# Patient Record
Sex: Male | Born: 1990 | Race: Black or African American | Hispanic: No | Marital: Single | Smoking: Current every day smoker
Health system: Southern US, Community
[De-identification: ages and names within clinical notes are randomized; demographics above are authoritative.]

## PROBLEM LIST (undated history)

## (undated) DIAGNOSIS — J45909 Unspecified asthma, uncomplicated: Secondary | ICD-10-CM

## (undated) DIAGNOSIS — J40 Bronchitis, not specified as acute or chronic: Secondary | ICD-10-CM

## (undated) HISTORY — PX: TONSILLECTOMY: SUR1361

---
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
## 2009-03-19 ENCOUNTER — Emergency Department (HOSPITAL_COMMUNITY): Admission: EM | Admit: 2009-03-19 | Discharge: 2009-03-20 | Payer: Self-pay | Admitting: Emergency Medicine

## 2010-10-14 NOTE — Consult Note (Signed)
NAME:  Brent Anderson, Brent Anderson               ACCOUNT NO.:  [id number]   MEDICAL RECORD NO.:  [id number]          PATIENT TYPE:  EMS   LOCATION:  ED                            FACILITY:  [facility]   PHYSICIAN:  J. Darreld Mclean, M.D. DATE OF BIRTH:  08-31-1990   DATE OF CONSULTATION:  DATE OF DISCHARGE:                                   CONSULTATION   HISTORY:  Dr. Rhae Lerner. [place] asked [place] to see the patient.  The patient  is a 20 year old male who injured his right lower tibia playing ball today.  He has a comminuted spiral fracture of the right tibia, essentially  nondisplaced.  No other injuries, no loss of consciousness.   IMPRESSION:  Spiral fracture, right mid shaft tibia, comminuted.   The patient was placed in a long-leg cast.  I had previously talked to his  mother and the patient concerning the injury.  He is going to be in the cast  for 8-12 weeks.  I told him to keep it dry, elevation, ice, crutches, and a  prescription for Tylenol No. 3 and gave the patient information booklet.   FOLLOWUP:  I will see him in the office on Tuesday.  We are closed for the  Holidays until Tuesday.  I will see him in the office at 1:30.  Any  difficulty at all, come back to the hospitalization or call me through the  hospital beeper system.  Numbers have been provided to the patient.  Information booklet provided.      JWK/MEDQ  D:  05/19/2004  T:  05/20/2004  Job:  [id number]

## 2011-02-01 ENCOUNTER — Emergency Department (HOSPITAL_COMMUNITY): Payer: Self-pay

## 2011-02-01 ENCOUNTER — Encounter: Payer: Self-pay | Admitting: Emergency Medicine

## 2011-02-01 ENCOUNTER — Emergency Department (HOSPITAL_COMMUNITY)
Admission: EM | Admit: 2011-02-01 | Discharge: 2011-02-01 | Disposition: A | Discharge status: Home / Self Care | Payer: Self-pay | Attending: Emergency Medicine | Admitting: Emergency Medicine

## 2011-02-01 DIAGNOSIS — X58XXXA Exposure to other specified factors, initial encounter: Secondary | ICD-10-CM | POA: Insufficient documentation

## 2011-02-01 DIAGNOSIS — T07XXXA Unspecified multiple injuries, initial encounter: Secondary | ICD-10-CM

## 2011-02-01 DIAGNOSIS — IMO0002 Reserved for concepts with insufficient information to code with codable children: Secondary | ICD-10-CM | POA: Insufficient documentation

## 2011-02-01 DIAGNOSIS — Y92009 Unspecified place in unspecified non-institutional (private) residence as the place of occurrence of the external cause: Secondary | ICD-10-CM | POA: Insufficient documentation

## 2011-02-01 DIAGNOSIS — F17200 Nicotine dependence, unspecified, uncomplicated: Secondary | ICD-10-CM | POA: Insufficient documentation

## 2011-02-01 DIAGNOSIS — S8392XA Sprain of unspecified site of left knee, initial encounter: Secondary | ICD-10-CM

## 2011-02-01 DIAGNOSIS — M25469 Effusion, unspecified knee: Secondary | ICD-10-CM | POA: Insufficient documentation

## 2011-02-01 DIAGNOSIS — M7989 Other specified soft tissue disorders: Secondary | ICD-10-CM | POA: Insufficient documentation

## 2011-02-01 MED ORDER — IBUPROFEN 600 MG PO TABS: 600.0000 mg | ORAL_TABLET | Freq: Four times a day (QID) | ORAL | Status: AC | PRN

## 2011-02-01 MED ORDER — HYDROMORPHONE HCL 1 MG/ML IJ SOLN
1.0000 mg | Freq: Once | INTRAMUSCULAR | Status: AC
  Administered 2011-02-01: 1 mg via INTRAMUSCULAR
  Filled 2011-02-01: qty 1

## 2011-02-01 MED ORDER — OXYCODONE-ACETAMINOPHEN 5-325 MG PO TABS: 1.0000 | ORAL_TABLET | ORAL | Status: AC | PRN

## 2011-02-01 NOTE — ED Provider Notes (Signed)
History     CSN: [id number] Arrival date & time: 02/01/2011  8:07 PM  Chief Complaint  Patient presents with  . Leg Injury   Patient is a 20 y.o. male presenting with extremity pain. The history is provided by the patient.  Extremity Pain This is a new (legs became caught in farm equipment resulting in  injuries to bilateral knees) problem. Episode onset: just prior to arrival. The problem occurs constantly. The problem has not changed since onset.The symptoms are aggravated by walking. The symptoms are relieved by nothing. He has tried nothing for the symptoms.    History reviewed. No pertinent past medical history.  Past Surgical History  Procedure Date  . Tonsillectomy     No family history on file.  History  Substance Use Topics  . Smoking status: Current Everyday Smoker -- 0.5 packs/day    Types: Cigarettes  . Smokeless tobacco: Not on file  . Alcohol Use: No      Review of Systems  All other systems reviewed and are negative.    Physical Exam  BP 131/73  Pulse 82  Temp(Src) 98.2 F (36.8 C) (Oral)  Resp 24  Wt 165 lb (74.844 kg)  SpO2 100%  Physical Exam  Vitals reviewed. Constitutional: He is oriented to person, place, and time. He appears well-developed and well-nourished.  HENT:  Head: Normocephalic.  Eyes: EOM are normal.  Neck: Normal range of motion.  Pulmonary/Chest: Effort normal.  Musculoskeletal: Normal range of motion.       Left knee with several abrasions as well as swelling of medial joint line with tenderness. Right knee with tenderness of anterior joint line. Bilateral lower extremities are NVI  Neurological: He is alert and oriented to person, place, and time.  Psychiatric: He has a normal mood and affect.    ED Course  Procedures  MDM Suspect left knee sprain. Ortho follow up given swelling and small joint effusion.  Tetanus UTD. Crutches and knee immobolizer  i personally reviewed the xrays    Dg Knee Complete 4 Views  Left  02/01/2011  *RADIOLOGY REPORT*  Clinical Data: Pain post fall.  LEFT KNEE - COMPLETE 4+ VIEW  Comparison: None.  Findings: Small effusion in the suprapatellar bursa.  Negative for fracture, dislocation, or other acute bony abnormality.  Normal mineralization and alignment.  No significant osseous degenerative change.  IMPRESSION:  1.  Negative for fracture. 2.  Small knee effusion.  Original Report Authenticated By: Osa Craver, M.D.   Dg Knee Complete 4 Views Right  02/01/2011  *RADIOLOGY REPORT*  Clinical Data: Pain after injury  RIGHT KNEE - COMPLETE 4+ VIEW  Comparison: 05/19/2004  Findings: The right knee appears intact. No evidence of acute fracture or subluxation. Small benign-appearing exostosis of the proximal lateral tibial metaphysis.  Bone matrix and cortex appear intact.  No abnormal radiopaque densities in the soft tissues.  No significant effusion.  IMPRESSION: No acute bony abnormalities demonstrated.  Original Report Authenticated By: Marlon Pel, M.D.        Lyanne Co, MD 02/01/11 [phone]

## 2011-02-01 NOTE — ED Notes (Signed)
Pt reports getting shorts caught in auger of post hole drill. Abrasion & brusing noted to the left leg.

## 2011-02-01 NOTE — ED Notes (Signed)
Ice pack applied for swelling

## 2011-02-01 NOTE — ED Notes (Signed)
Patient states got his shorts caught in a piece of machinery and his shorts wrapped around his left knee.  Patient with abrasions noted to left knee circumferentially.

## 2012-07-28 IMAGING — CR DG KNEE COMPLETE 4+V*L*
4 series · 4 of 4 positions shown · non-contrast
Comparison: None.

CLINICAL DATA: Pain post fall.

LEFT KNEE - COMPLETE 4+ VIEW

[view not recorded (1 of 4)]
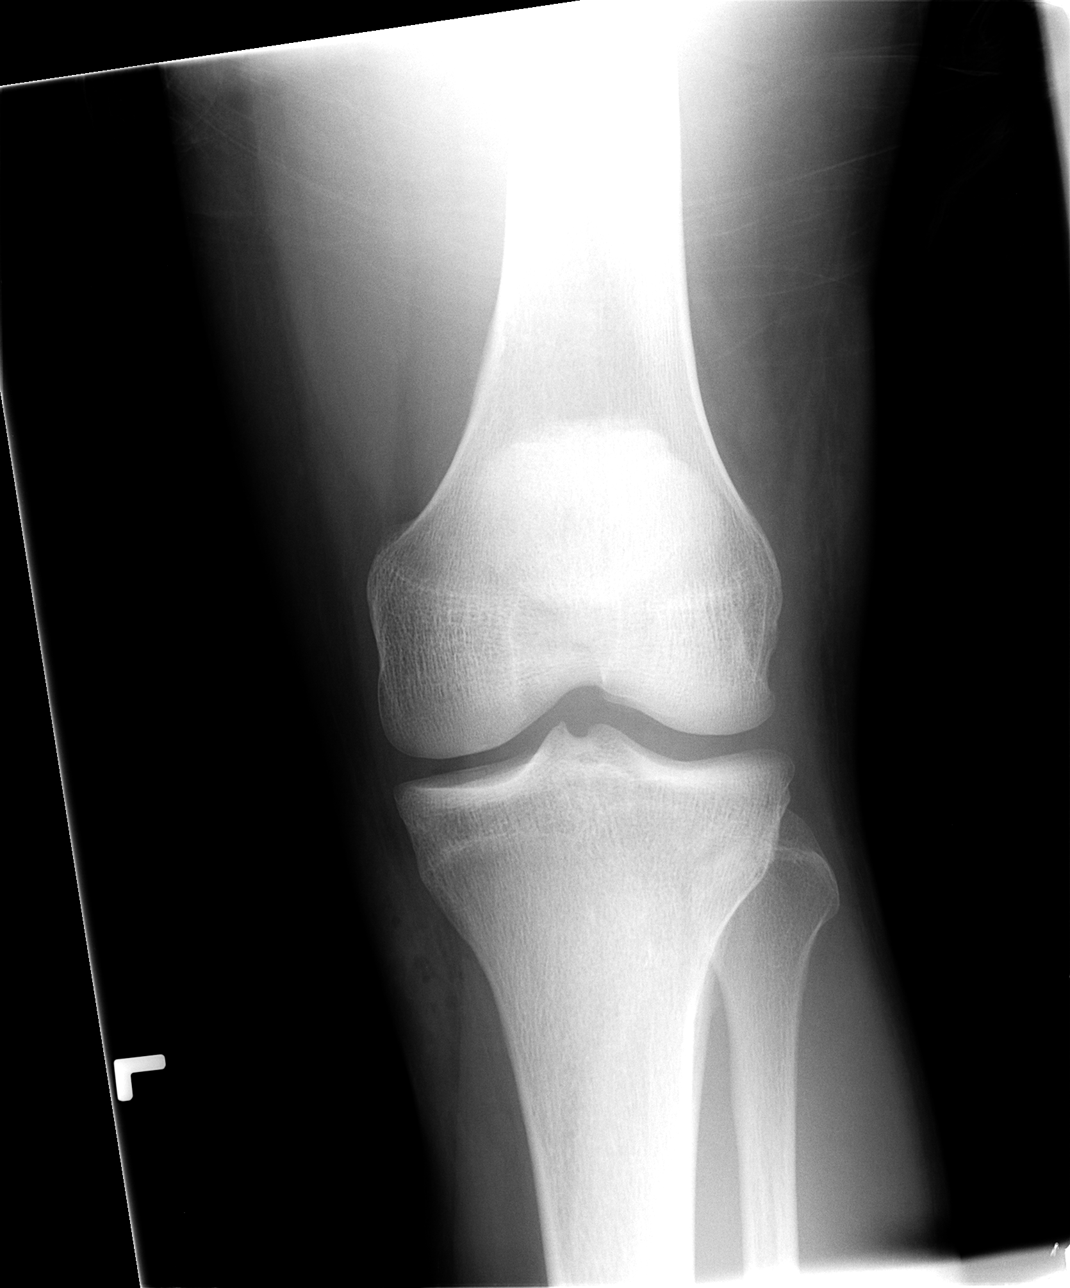

[view not recorded (2 of 4)]
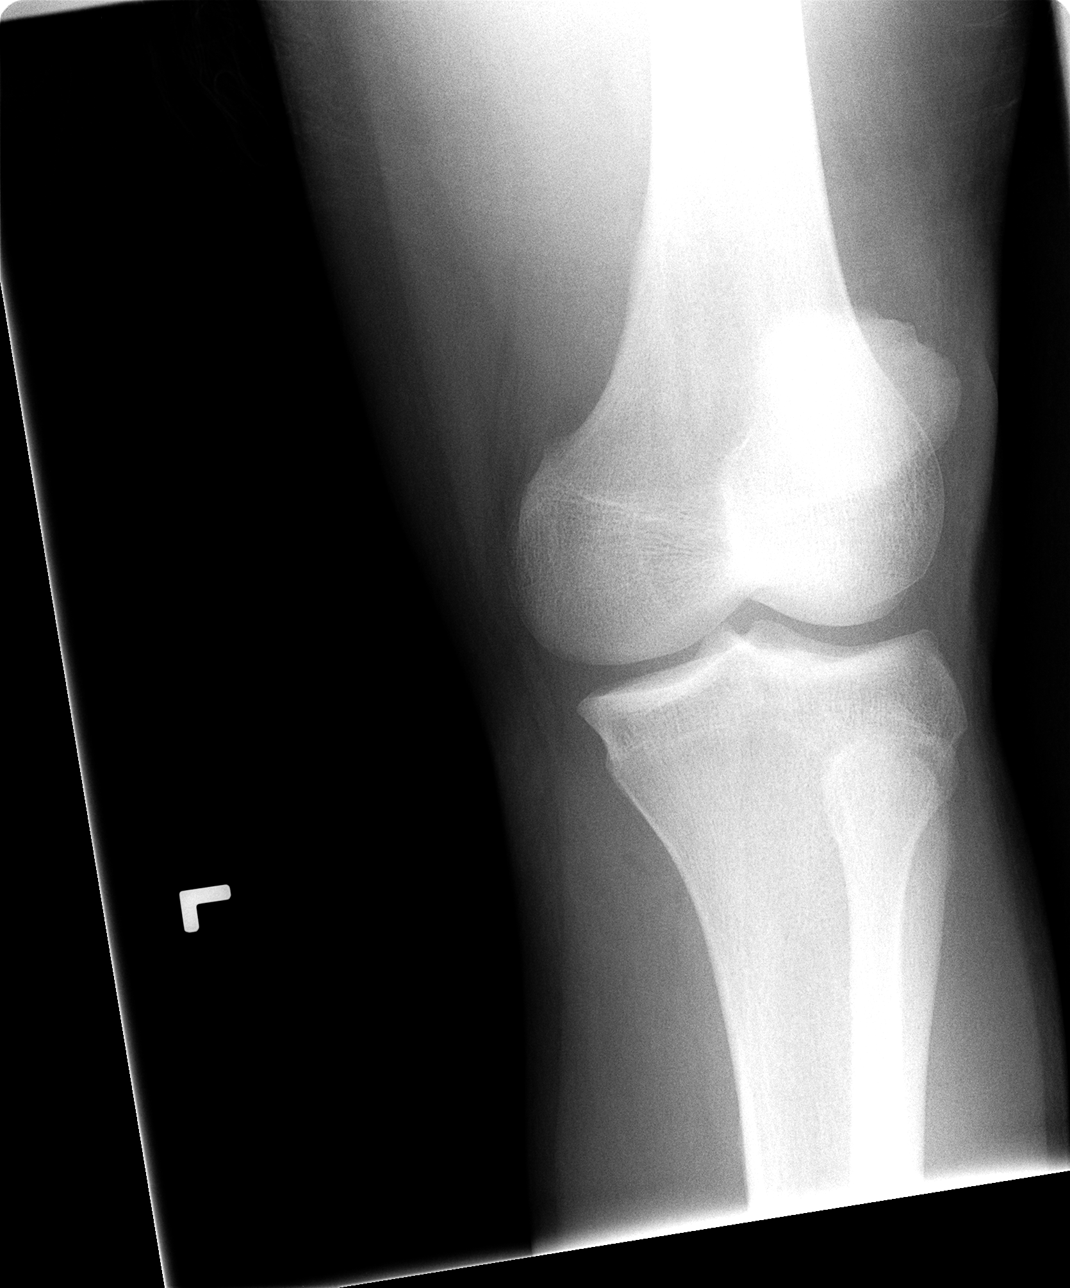

[view not recorded (3 of 4)]
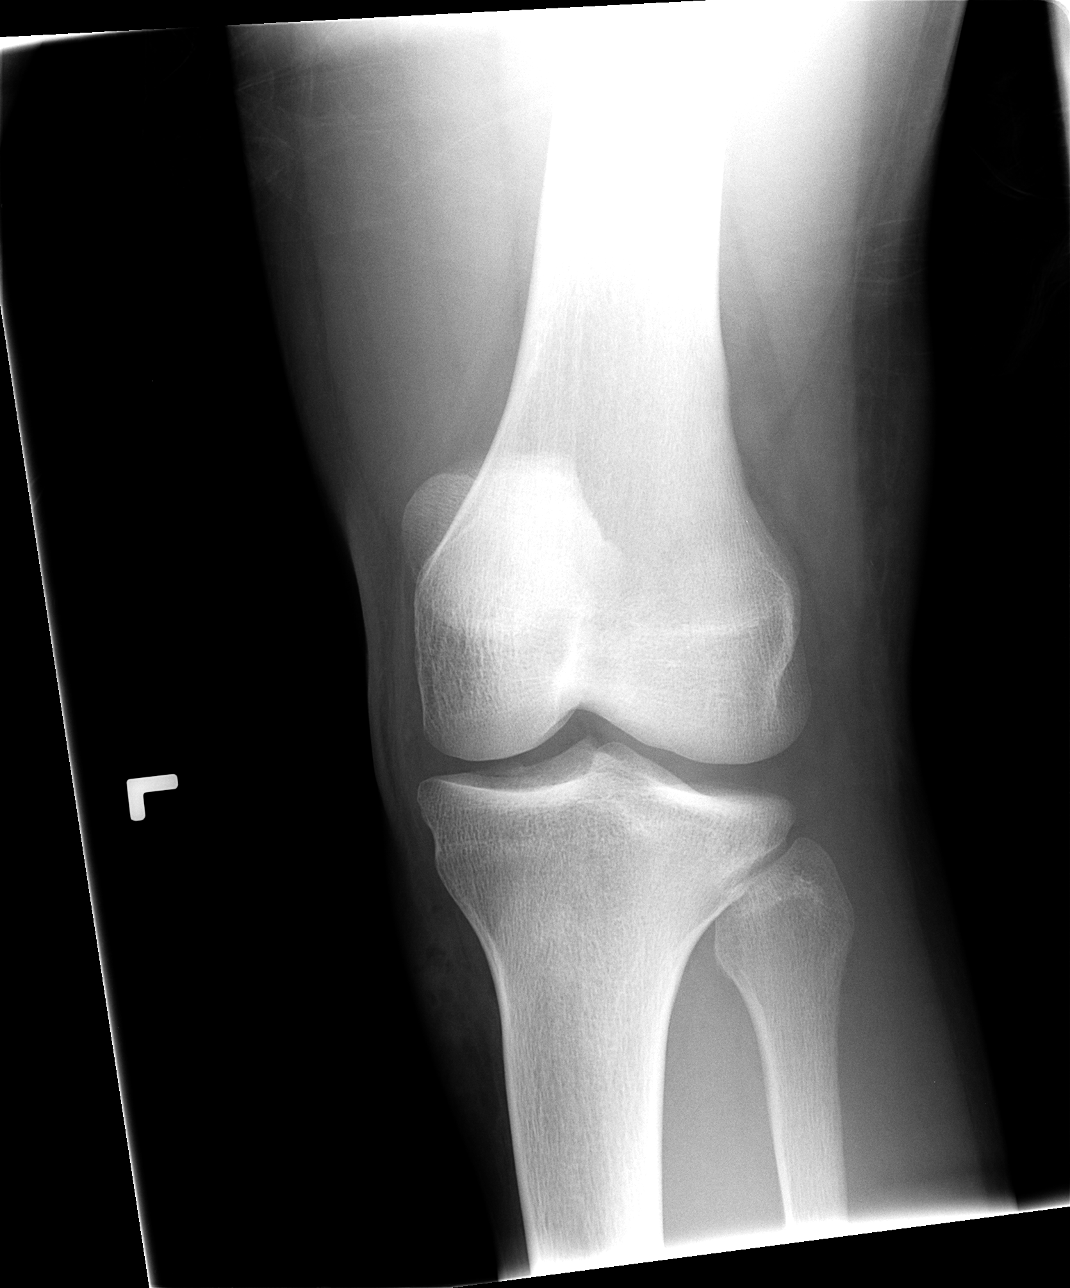

[view not recorded (4 of 4)]
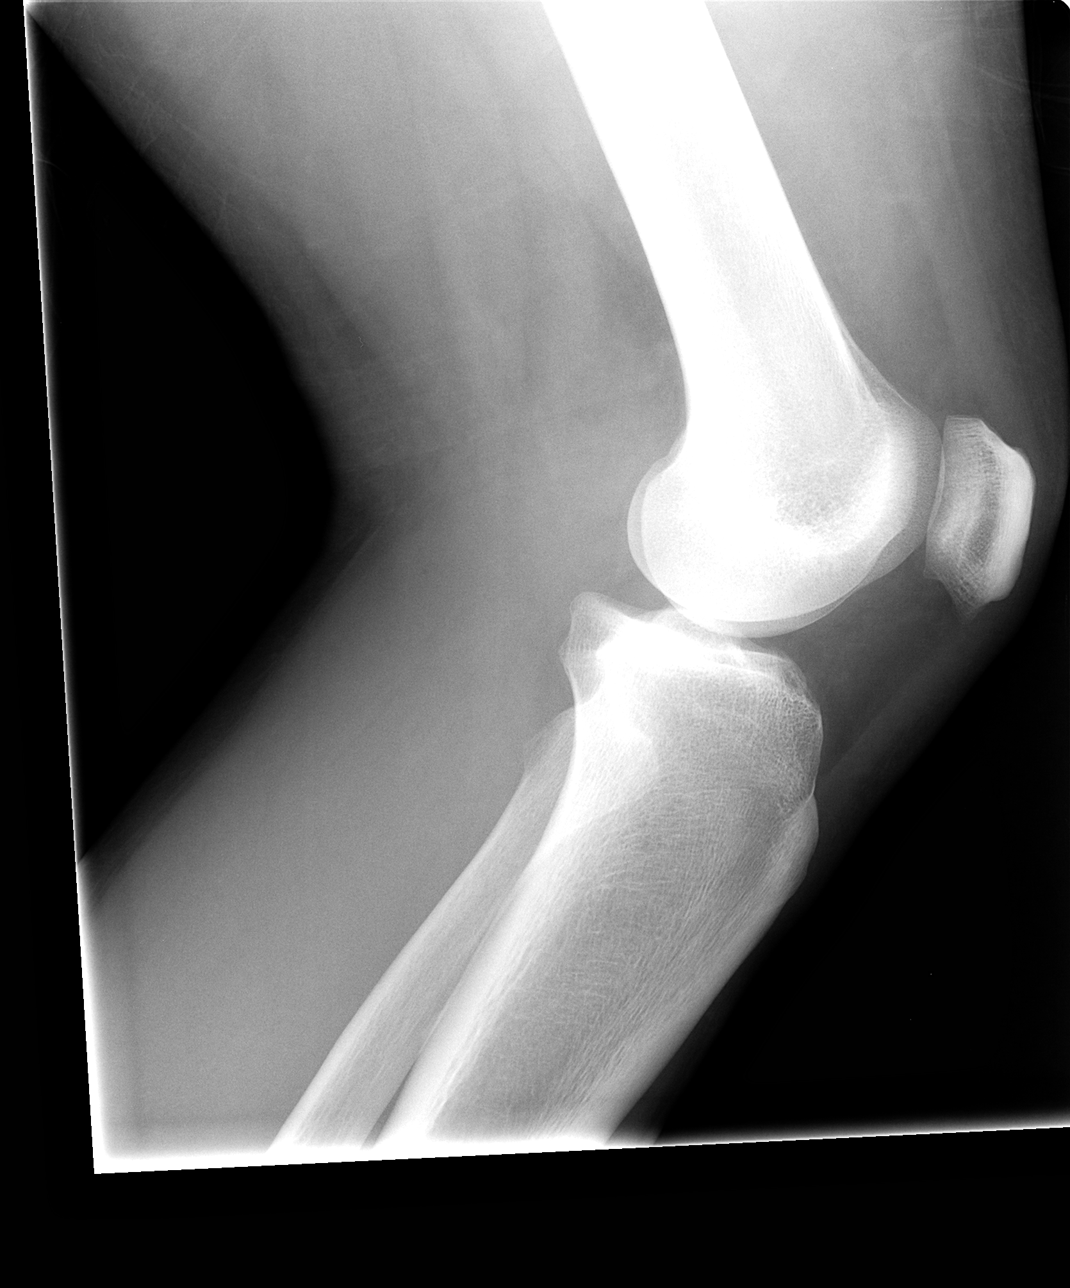

[4 of 4 positions shown; findings below may reference images not displayed]

FINDINGS: Small effusion in the suprapatellar bursa.  Negative for
fracture, dislocation, or other acute bony abnormality.  Normal
mineralization and alignment.  No significant osseous degenerative
change.
IMPRESSION: 1.  Negative for fracture.
2.  Small knee effusion.

## 2015-07-08 ENCOUNTER — Telehealth: Payer: Self-pay | Admitting: *Deleted

## 2015-07-08 MED ORDER — HYDROCODONE-ACETAMINOPHEN 5-325 MG PO TABS: 1.0000 | ORAL_TABLET | ORAL | Status: DC | PRN

## 2015-07-08 NOTE — Telephone Encounter (Signed)
Rx printed

## 2015-07-08 NOTE — Addendum Note (Signed)
Addended by: Earnstine Regal on: 07/08/2015 11:17 AM   Modules accepted: Orders

## 2015-07-08 NOTE — Telephone Encounter (Signed)
Needs refill on Hydrocodone 5

## 2015-07-28 ENCOUNTER — Ambulatory Visit: Payer: Self-pay | Admitting: Orthopaedic Surgery

## 2015-08-17 ENCOUNTER — Ambulatory Visit (INDEPENDENT_AMBULATORY_CARE_PROVIDER_SITE_OTHER): Payer: Self-pay | Admitting: Orthopaedic Surgery

## 2015-08-17 ENCOUNTER — Encounter: Payer: Self-pay | Admitting: Orthopaedic Surgery

## 2015-08-17 VITALS — BP 137/76 | HR 62 | Temp 97.9°F | Resp 16 | Ht 66.0 in | Wt 151.2 lb

## 2015-08-17 DIAGNOSIS — M25561 Pain in right knee: Secondary | ICD-10-CM

## 2015-08-17 MED ORDER — IBUPROFEN 600 MG PO TABS: 600.0000 mg | ORAL_TABLET | Freq: Four times a day (QID) | ORAL | Status: DC | PRN

## 2015-08-17 NOTE — Patient Instructions (Signed)
Begin ibuprofen, stop the narcotics

## 2015-08-17 NOTE — Progress Notes (Signed)
Patient ZO:XWRUEA:Brent Anderson, male DOB:02-22-1991, 25 y.o. VWU:[id number]  Chief Complaint  Patient presents with  . Follow-up    follow up Right knee "same"    HPI  Brent Anderson is a 25 y.o. male who has a history of right knee pain.  He has no swelling, no giving way, no locking, no popping. He is doing well.  HPI  Body mass index is 24.42 kg/(m^2).  Review of Systems  Patient does not have Diabetes Mellitus. Patient does not have hypertension. Patient has COPD or shortness of breath. Patient does not have BMI > 35. Patient has current smoking history.  Review of Systems  Constitutional: Negative for activity change.       Patient does not have Diabetes Mellitus. Patient does not have hypertension. Patient has COPD or shortness of breath. Patient does not have BMI > 35. Patient has current smoking history.  HENT: Negative for congestion.   Respiratory: Positive for cough and wheezing.   Musculoskeletal: Positive for arthralgias and gait problem.    No past medical history on file.  Past Surgical History  Procedure Laterality Date  . Tonsillectomy      No family history on file.  Social History Social History  Substance Use Topics  . Smoking status: Current Every Day Smoker -- 0.50 packs/day    Types: Cigarettes  . Smokeless tobacco: None  . Alcohol Use: No    No Known Allergies  Current Outpatient Prescriptions  Medication Sig Dispense Refill  . HYDROcodone-acetaminophen (NORCO/VICODIN) 5-325 MG tablet Take 1 tablet by mouth every 4 (four) hours as needed for moderate pain (Must last 30 days.  Do not take and drive a car or use machinery.). 120 tablet 0   No current facility-administered medications for this visit.     Physical Exam  Blood pressure 137/76, pulse 62, temperature 97.9 F (36.6 C), resp. rate 16, height 5\' 6"  (1.676 m), weight 151 lb 3.2 oz (68.584 kg).  Constitutional: overall normal hygiene, normal nutrition, well developed, normal  grooming, normal body habitus. Assistive device:none  Musculoskeletal: gait and station Limp none, muscle tone and strength are normal, no tremors or atrophy is present.  .  Neurological: coordination overall normal.  Deep tendon reflex/nerve stretch intact.  Sensation normal.  Cranial nerves II-XII intact.   Skin:   normal overall no scars, lesions, ulcers or rashes. No psoriasis.  Psychiatric: Alert and oriented x 3.  Recent memory intact, remote memory unclear.  Normal mood and affect. Well groomed.  Good eye contact.  Cardiovascular: overall no swelling, no varicosities, no edema bilaterally, normal temperatures of the legs and arms, no clubbing, cyanosis and good capillary refill.  Lymphatic: palpation is normal.  The right lower extremity is examined:  Inspection:  Thigh:  Non-tender and no defects  Knee does not have swelling 0 effusion.                        Joint tenderness is not present                        Patient is not tender over the medial joint line  Lower Leg:  Has normal appearance and no tenderness or defects  Ankle:  Non-tender and no defects  Foot:  Non-tender and no defects Range of Motion:  Knee:  Range of motion is: full and normal  Crepitus is not  present  Ankle:  Range of motion is normal. Strength and Tone:  The right lower extremity has normal strength and tone. Stability:  Knee:  The knee is stable.  Ankle:  The ankle is stable.   I will stop the narcotics as he has a normal exam today.   I will call in ibuprofen to [facility]  The patient has been educated about the nature of the problem(s) and counseled on treatment options.  The patient appeared to understand what I have discussed and is in agreement with it.  PLAN Call if any problems.  Precautions discussed.  Continue current medications.   Return to clinic 3 months

## 2015-11-16 ENCOUNTER — Ambulatory Visit: Payer: Self-pay | Admitting: Orthopaedic Surgery

## 2015-11-27 ENCOUNTER — Encounter (HOSPITAL_COMMUNITY): Payer: Self-pay | Admitting: Emergency Medicine

## 2015-11-27 ENCOUNTER — Emergency Department (HOSPITAL_COMMUNITY)
Admission: EM | Admit: 2015-11-27 | Discharge: 2015-11-27 | Disposition: A | Discharge status: Home / Self Care | Payer: Self-pay | Attending: Emergency Medicine | Admitting: Emergency Medicine

## 2015-11-27 DIAGNOSIS — F17210 Nicotine dependence, cigarettes, uncomplicated: Secondary | ICD-10-CM | POA: Insufficient documentation

## 2015-11-27 DIAGNOSIS — R21 Rash and other nonspecific skin eruption: Secondary | ICD-10-CM | POA: Insufficient documentation

## 2015-11-27 MED ORDER — FAMOTIDINE 20 MG PO TABS
20.0000 mg | ORAL_TABLET | Freq: Once | ORAL | Status: AC
  Administered 2015-11-27: 20 mg via ORAL
  Filled 2015-11-27: qty 1

## 2015-11-27 MED ORDER — HYDROXYZINE PAMOATE 25 MG PO CAPS: 25.0000 mg | ORAL_CAPSULE | Freq: Three times a day (TID) | ORAL | Status: AC | PRN

## 2015-11-27 MED ORDER — PREDNISONE 50 MG PO TABS
60.0000 mg | ORAL_TABLET | Freq: Once | ORAL | Status: AC
  Administered 2015-11-27: 60 mg via ORAL
  Filled 2015-11-27: qty 1

## 2015-11-27 MED ORDER — HYDROXYZINE HCL 25 MG PO TABS
25.0000 mg | ORAL_TABLET | Freq: Once | ORAL | Status: AC
  Administered 2015-11-27: 25 mg via ORAL
  Filled 2015-11-27: qty 1

## 2015-11-27 MED ORDER — DEXAMETHASONE 4 MG PO TABS: 4.0000 mg | ORAL_TABLET | Freq: Two times a day (BID) | ORAL | Status: AC

## 2015-11-27 NOTE — ED Notes (Signed)
Pt states he has had a rash on right hand for 2 days.

## 2015-11-27 NOTE — ED Provider Notes (Signed)
CSN: [id number]     Arrival date & time 11/27/15  1134 History   First MD Initiated Contact with Patient 11/27/15 1202     Chief Complaint  Patient presents with  . Rash     (Consider location/radiation/quality/duration/timing/severity/associated sxs/prior Treatment) HPI Comments: Patient states that over the last 2-3 days he has noticed a rash on his hands. This is spreading to his arms and other areas. The patient denies use of gloves. He denies having his hands and chemicals. He states he has been using a different lotion recently. He also reports having mowed the grass about 3 days ago just before the rash started on. He complains of itching that would not respond to over-the-counter medications. He has not had any difficulty with breathing, swallowing, or other issues.  The history is provided by the patient.    History reviewed. No pertinent past medical history. Past Surgical History  Procedure Laterality Date  . Tonsillectomy     History reviewed. No pertinent family history. Social History  Substance Use Topics  . Smoking status: Current Every Day Smoker -- 0.50 packs/day    Types: Cigarettes  . Smokeless tobacco: None  . Alcohol Use: No    Review of Systems  Constitutional: Negative for activity change.       All ROS Neg except as noted in HPI  HENT: Negative for nosebleeds.   Eyes: Negative for photophobia and discharge.  Respiratory: Negative for cough, shortness of breath and wheezing.   Cardiovascular: Negative for chest pain and palpitations.  Gastrointestinal: Negative for abdominal pain and blood in stool.  Genitourinary: Negative for dysuria, frequency and hematuria.  Musculoskeletal: Negative for back pain, arthralgias and neck pain.  Skin: Negative.   Neurological: Negative for dizziness, seizures and speech difficulty.  Psychiatric/Behavioral: Negative for hallucinations and confusion.      Allergies  Review of patient's allergies indicates no known  allergies.  Home Medications   Prior to Admission medications   Medication Sig Start Date End Date Taking? Authorizing Provider  ibuprofen (ADVIL,MOTRIN) 600 MG tablet Take 1 tablet (600 mg total) by mouth every 6 (six) hours as needed. 08/17/15   Darreld McleanWayne Keeling, MD   BP 117/73 mmHg  Pulse 61  Temp(Src) 98.2 F (36.8 C) (Oral)  Resp 16  Ht 5\' 6"  (1.676 m)  Wt 74.844 kg  BMI 26.64 kg/m2  SpO2 100% Physical Exam  Constitutional: He is oriented to person, place, and time. He appears well-developed and well-nourished.  Non-toxic appearance.  HENT:  Head: Normocephalic.  Right Ear: Tympanic membrane and external ear normal.  Left Ear: Tympanic membrane and external ear normal.  Eyes: EOM and lids are normal. Pupils are equal, round, and reactive to light.  Neck: Normal range of motion. Neck supple. Carotid bruit is not present.  Cardiovascular: Normal rate, regular rhythm, normal heart sounds, intact distal pulses and normal pulses.   Pulmonary/Chest: Breath sounds normal. No respiratory distress.  Abdominal: Soft. Bowel sounds are normal. There is no tenderness. There is no guarding.  Musculoskeletal: Normal range of motion.  Lymphadenopathy:       Head (right side): No submandibular adenopathy present.       Head (left side): No submandibular adenopathy present.    He has no cervical adenopathy.  Neurological: He is alert and oriented to person, place, and time. He has normal strength. No cranial nerve deficit or sensory deficit.  Skin: Skin is warm and dry.  Particular papular rash on the hands, arms, and a  few areas on the face on. A few of them have the blisters present. There is no red streaks noted. And there is no drainage from the areas.  Psychiatric: He has a normal mood and affect. His speech is normal.  Nursing note and vitals reviewed.   ED Course  Procedures (including critical care time) Labs Review Labs Reviewed - No data to display  Imaging Review No results  found. I have personally reviewed and evaluated these images and lab results as part of my medical decision-making.   EKG Interpretation None      MDM  Vital signs are within normal limits. There is no evidence of any airway compromise. The examination favors a probable contact dermatitis on. The patient will be treated with Decadron and Vistaril. Patient is referred to dermatology if these plans are not effective.    Final diagnoses:  Rash    **I have reviewed nursing notes, vital signs, and all appropriate lab and imaging results for this patient.Ivery Quale*    Danta Baumgardner, PA-C 11/28/15 1309  Benjiman CoreNathan Pickering, MD 11/29/15 [phone]

## 2015-11-27 NOTE — Discharge Instructions (Signed)
Please use Decadron 2 times daily. Use Claritin or over-the-counter Allegra for mild itching during the day, use Vistaril at bedtime or 3 times daily for itching if needed.This medication may cause drowsiness. Please do not drink, drive, or participate in activity that requires concentration while taking this medication. Please see the dermatologist listed above if not improving.

## 2016-08-12 ENCOUNTER — Emergency Department (HOSPITAL_COMMUNITY): Payer: No Typology Code available for payment source

## 2016-08-12 ENCOUNTER — Emergency Department (HOSPITAL_COMMUNITY)
Admission: EM | Admit: 2016-08-12 | Discharge: 2016-08-12 | Disposition: A | Discharge status: Home / Self Care | Payer: No Typology Code available for payment source | Attending: Emergency Medicine | Admitting: Emergency Medicine

## 2016-08-12 ENCOUNTER — Encounter (HOSPITAL_COMMUNITY): Payer: Self-pay

## 2016-08-12 DIAGNOSIS — M25511 Pain in right shoulder: Secondary | ICD-10-CM | POA: Diagnosis not present

## 2016-08-12 DIAGNOSIS — Y939 Activity, unspecified: Secondary | ICD-10-CM | POA: Insufficient documentation

## 2016-08-12 DIAGNOSIS — F17210 Nicotine dependence, cigarettes, uncomplicated: Secondary | ICD-10-CM | POA: Insufficient documentation

## 2016-08-12 DIAGNOSIS — Y999 Unspecified external cause status: Secondary | ICD-10-CM | POA: Insufficient documentation

## 2016-08-12 DIAGNOSIS — Y92410 Unspecified street and highway as the place of occurrence of the external cause: Secondary | ICD-10-CM | POA: Insufficient documentation

## 2016-08-12 DIAGNOSIS — S4991XA Unspecified injury of right shoulder and upper arm, initial encounter: Secondary | ICD-10-CM | POA: Diagnosis present

## 2016-08-12 MED ORDER — CYCLOBENZAPRINE HCL 10 MG PO TABS: 10.0000 mg | ORAL_TABLET | Freq: Two times a day (BID) | ORAL | 0 refills | Status: DC | PRN

## 2016-08-12 MED ORDER — IBUPROFEN 600 MG PO TABS: 600.0000 mg | ORAL_TABLET | Freq: Four times a day (QID) | ORAL | 0 refills | Status: DC | PRN

## 2016-08-12 NOTE — Discharge Instructions (Signed)
Do not drive while taking the muscle relaxant as it will make you sleepy. °
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
## 2018-04-25 ENCOUNTER — Other Ambulatory Visit: Payer: Self-pay

## 2018-04-25 ENCOUNTER — Encounter (HOSPITAL_COMMUNITY): Payer: Self-pay | Admitting: Emergency Medicine

## 2018-04-25 ENCOUNTER — Emergency Department (HOSPITAL_COMMUNITY)
Admission: EM | Admit: 2018-04-25 | Discharge: 2018-04-25 | Disposition: A | Discharge status: Home / Self Care | Payer: Self-pay | Attending: Emergency Medicine | Admitting: Emergency Medicine

## 2018-04-25 DIAGNOSIS — K029 Dental caries, unspecified: Secondary | ICD-10-CM | POA: Insufficient documentation

## 2018-04-25 DIAGNOSIS — Z79899 Other long term (current) drug therapy: Secondary | ICD-10-CM | POA: Insufficient documentation

## 2018-04-25 DIAGNOSIS — K0889 Other specified disorders of teeth and supporting structures: Secondary | ICD-10-CM

## 2018-04-25 DIAGNOSIS — F17210 Nicotine dependence, cigarettes, uncomplicated: Secondary | ICD-10-CM | POA: Insufficient documentation

## 2018-04-25 MED ORDER — IBUPROFEN 400 MG PO TABS
600.0000 mg | ORAL_TABLET | Freq: Once | ORAL | Status: AC
  Administered 2018-04-25: 600 mg via ORAL
  Filled 2018-04-25: qty 2

## 2018-04-25 MED ORDER — ACETAMINOPHEN 325 MG PO TABS
650.0000 mg | ORAL_TABLET | Freq: Once | ORAL | Status: AC
  Administered 2018-04-25: 650 mg via ORAL
  Filled 2018-04-25: qty 2

## 2018-04-25 MED ORDER — PENICILLIN V POTASSIUM 250 MG PO TABS
500.0000 mg | ORAL_TABLET | Freq: Once | ORAL | Status: AC
  Administered 2018-04-25: 500 mg via ORAL
  Filled 2018-04-25: qty 2

## 2018-04-25 MED ORDER — PENICILLIN V POTASSIUM 500 MG PO TABS: 500.0000 mg | ORAL_TABLET | Freq: Three times a day (TID) | ORAL | 0 refills | Status: DC

## 2018-04-25 NOTE — ED Provider Notes (Signed)
[facility] EMERGENCY DEPARTMENT Provider Note   CSN: [id number] Arrival date & time: 04/25/18  0545  Time seen 06:08 AM   History   Chief Complaint Chief Complaint  Patient presents with  . Dental Pain    HPI Brent Anderson is a 27 y.o. male.  HPI patient states he has been having some pain in his right lower wisdom to off and on for the past 3 months that he states has been some mild aching however this morning it woke him up at 2 AM being more intense.  He denies fever or swelling of his face.  He states that tooth has been cracked in half of its gone.  He denies any sensitivity to hot or cold or having pain when air hits his tooth when he breathes in.  He states he does not have a dentist however when I look in his mouth he is obviously had a dental work in the past and then he then states he saw a Education officer, community on [employer].  PCP Gareth Morgan, MD   History reviewed. No pertinent past medical history.  There are no active problems to display for this patient.   Past Surgical History:  Procedure Laterality Date  . TONSILLECTOMY          Home Medications    Prior to Admission medications   Medication Sig Start Date End Date Taking? Authorizing Provider  cyclobenzaprine (FLEXERIL) 10 MG tablet Take 1 tablet (10 mg total) by mouth 2 (two) times daily as needed for muscle spasms. 08/12/16   Janne Napoleon, NP  dexamethasone (DECADRON) 4 MG tablet Take 1 tablet (4 mg total) by mouth 2 (two) times daily with a meal. 11/27/15   Ivery Quale, PA-C  hydrOXYzine (VISTARIL) 25 MG capsule Take 1 capsule (25 mg total) by mouth 3 (three) times daily as needed. 11/27/15   Ivery Quale, PA-C  ibuprofen (ADVIL,MOTRIN) 600 MG tablet Take 1 tablet (600 mg total) by mouth every 6 (six) hours as needed. 08/12/16   Janne Napoleon, NP  penicillin v potassium (VEETID) 500 MG tablet Take 1 tablet (500 mg total) by mouth 3 (three) times daily. 04/25/18   Devoria Albe, MD    Family  History History reviewed. No pertinent family history.  Social History Social History   Tobacco Use  . Smoking status: Current Every Day Smoker    Packs/day: 0.50    Types: Cigarettes  . Smokeless tobacco: Never Used  Substance Use Topics  . Alcohol use: No  . Drug use: Yes    Types: Marijuana  employed   Allergies   Patient has no known allergies.   Review of Systems Review of Systems  All other systems reviewed and are negative.    Physical Exam Updated Vital Signs BP 122/85 (BP Location: Left Arm)   Pulse 68   Temp 97.9 F (36.6 C) (Oral)   Resp 16   Ht 5\' 8"  (1.727 m)   Wt 67.1 kg   SpO2 100%   BMI 22.50 kg/m   Vital signs normal    Physical Exam  Constitutional: He is oriented to person, place, and time.  Thin male  HENT:  Head: Normocephalic and atraumatic.  Right Ear: External ear normal.  Left Ear: External ear normal.  Nose: Nose normal.  Mouth/Throat: Oropharynx is clear and moist.    Eyes: Pupils are equal, round, and reactive to light. Conjunctivae and EOM are normal.  Neck: Normal range of motion.  Cardiovascular:  Normal rate.  Pulmonary/Chest: Effort normal. No respiratory distress.  Musculoskeletal: Normal range of motion.  Neurological: He is alert and oriented to person, place, and time. No cranial nerve deficit.  Skin: Skin is warm and dry.  Psychiatric: He has a normal mood and affect. His behavior is normal. Thought content normal.  Nursing note and vitals reviewed.    ED Treatments / Results  Labs (all labs ordered are listed, but only abnormal results are displayed) Labs Reviewed - No data to display  EKG None  Radiology No results found.  Procedures Procedures (including critical care time)  Medications Ordered in ED Medications  penicillin v potassium (VEETID) tablet 500 mg (has no administration in time range)  ibuprofen (ADVIL,MOTRIN) tablet 600 mg (has no administration in time range)  acetaminophen  (TYLENOL) tablet 650 mg (has no administration in time range)     Initial Impression / Assessment and Plan / ED Course  I have reviewed the triage vital signs and the nursing notes.  Pertinent labs & imaging results that were available during my care of the patient were reviewed by me and considered in my medical decision making (see chart for details).     Patient has a large cavity of his right lower molar.  He was started on pen VK and he was advised to take ibuprofen and Tylenol over-the-counter for pain.  He should try to follow-up with the dentist this coming week.  Please note there is no adult dentist on-call today.  Final Clinical Impressions(s) / ED Diagnoses   Final diagnoses:  Pain, dental  Dental caries    ED Discharge Orders         Ordered    penicillin v potassium (VEETID) 500 MG tablet  3 times daily     04/25/18 0621        OTC ibuprofen and acetaminophen  Plan discharge  Devoria AlbeIva Kaimen Peine, MD, Concha PyoFACEP    Nashalie Sallis, MD 04/25/18 [id number]

## 2018-04-25 NOTE — ED Triage Notes (Signed)
Pt c/o of right lower tooth hurting.

## 2018-04-25 NOTE — Discharge Instructions (Addendum)
Use heat over your face for comfort.  Take acetaminophen 650 mg plus ibuprofen 600 mg 4 times a day for pain.  Take the antibiotic until gone.  You will need to see a dentist to get definitive care of your tooth,  i.e. have the tooth pulled.  Return to the emergency department if you get a lot of facial swelling, or have difficulty swallowing or breathing.

## 2019-03-27 ENCOUNTER — Encounter (HOSPITAL_COMMUNITY): Payer: Self-pay

## 2019-03-27 ENCOUNTER — Emergency Department (HOSPITAL_COMMUNITY)
Admission: EM | Admit: 2019-03-27 | Discharge: 2019-03-27 | Disposition: A | Discharge status: Home / Self Care | Payer: Self-pay | Attending: Emergency Medicine | Admitting: Emergency Medicine

## 2019-03-27 ENCOUNTER — Other Ambulatory Visit: Payer: Self-pay

## 2019-03-27 DIAGNOSIS — K029 Dental caries, unspecified: Secondary | ICD-10-CM | POA: Insufficient documentation

## 2019-03-27 DIAGNOSIS — F17210 Nicotine dependence, cigarettes, uncomplicated: Secondary | ICD-10-CM | POA: Insufficient documentation

## 2019-03-27 DIAGNOSIS — F1210 Cannabis abuse, uncomplicated: Secondary | ICD-10-CM | POA: Insufficient documentation

## 2019-03-27 MED ORDER — PENICILLIN V POTASSIUM 500 MG PO TABS: 500.0000 mg | ORAL_TABLET | Freq: Four times a day (QID) | ORAL | 0 refills | Status: AC

## 2019-03-27 NOTE — ED Provider Notes (Signed)
[facility] EMERGENCY DEPARTMENT Provider Note   CSN: [id number] Arrival date & time: 03/27/19  0725     History   Chief Complaint Chief Complaint  Patient presents with  . Dental Pain    HPI Brent Anderson is a 28 y.o. male.     28 year old male with left lower dental pain.  Patient first noticed discomfort in the tooth about 2 weeks ago, more painful for the past few days.  Denies fever, trauma, drainage.  No other complaints or concerns.     History reviewed. No pertinent past medical history.  There are no active problems to display for this patient.   Past Surgical History:  Procedure Laterality Date  . TONSILLECTOMY          Home Medications    Prior to Admission medications   Medication Sig Start Date End Date Taking? Authorizing Provider  cyclobenzaprine (FLEXERIL) 10 MG tablet Take 1 tablet (10 mg total) by mouth 2 (two) times daily as needed for muscle spasms. 08/12/16   Janne Napoleon, NP  dexamethasone (DECADRON) 4 MG tablet Take 1 tablet (4 mg total) by mouth 2 (two) times daily with a meal. 11/27/15   Ivery Quale, PA-C  hydrOXYzine (VISTARIL) 25 MG capsule Take 1 capsule (25 mg total) by mouth 3 (three) times daily as needed. 11/27/15   Ivery Quale, PA-C  ibuprofen (ADVIL,MOTRIN) 600 MG tablet Take 1 tablet (600 mg total) by mouth every 6 (six) hours as needed. 08/12/16   Janne Napoleon, NP  penicillin v potassium (VEETID) 500 MG tablet Take 1 tablet (500 mg total) by mouth 4 (four) times daily for 7 days. 03/27/19 04/03/19  Jeannie Fend, PA-C    Family History No family history on file.  Social History Social History   Tobacco Use  . Smoking status: Current Every Day Smoker    Packs/day: 0.50    Types: Cigarettes  . Smokeless tobacco: Never Used  Substance Use Topics  . Alcohol use: No  . Drug use: Yes    Types: Marijuana     Allergies   Patient has no known allergies.   Review of Systems Review of Systems  Constitutional:  Negative for fever.  HENT: Positive for dental problem. Negative for ear pain, facial swelling, trouble swallowing and voice change.   Gastrointestinal: Negative for nausea and vomiting.  Musculoskeletal: Negative for neck pain.  Skin: Negative for rash and wound.  Allergic/Immunologic: Negative for immunocompromised state.  Neurological: Negative for headaches.  Hematological: Negative for adenopathy.  Psychiatric/Behavioral: Negative for confusion.  All other systems reviewed and are negative.    Physical Exam Updated Vital Signs BP (!) 146/95 (BP Location: Left Arm)   Pulse 65   Temp 98 F (36.7 C) (Oral)   Resp 14   Ht 5\' 7"  (1.702 m)   Wt 64 kg   SpO2 100%   BMI 22.08 kg/m   Physical Exam Vitals signs and nursing note reviewed.  Constitutional:      General: He is not in acute distress.    Appearance: He is well-developed. He is not diaphoretic.  HENT:     Head: Normocephalic and atraumatic.     Mouth/Throat:     Mouth: Mucous membranes are moist.     Dentition: Abnormal dentition. Dental tenderness and dental caries present. No gingival swelling, dental abscesses or gum lesions.     Pharynx: Oropharynx is clear. Uvula midline.   Neck:     Musculoskeletal: Neck supple.  Pulmonary:     Effort: Pulmonary effort is normal.  Lymphadenopathy:     Cervical: No cervical adenopathy.  Skin:    General: Skin is warm and dry.     Findings: No erythema or rash.  Neurological:     Mental Status: He is alert and oriented to person, place, and time.  Psychiatric:        Behavior: Behavior normal.      ED Treatments / Results  Labs (all labs ordered are listed, but only abnormal results are displayed) Labs Reviewed - No data to display  EKG None  Radiology No results found.  Procedures Procedures (including critical care time)  Medications Ordered in ED Medications - No data to display   Initial Impression / Assessment and Plan / ED Course  I have  reviewed the triage vital signs and the nursing notes.  Pertinent labs & imaging results that were available during my care of the patient were reviewed by me and considered in my medical decision making (see chart for details).  Clinical Course as of Mar 26 752  Thu Mar 26, 9865  5913 28 year old male with complaint of left lower dental pain, known cavity.  Denies fever, trauma, drainage.  No overlying facial cellulitis, no lymphadenopathy.  Patient start on penicillin, advised take Motrin Tylenol and referred to area dentist.   [LM]    Clinical Course User Index [LM] Tacy Learn, PA-C        Final Clinical Impressions(s) / ED Diagnoses   Final diagnoses:  Dental caries    ED Discharge Orders         Ordered    penicillin v potassium (VEETID) 500 MG tablet  4 times daily     03/27/19 0749           Tacy Learn, PA-C 03/27/19 6720    Daleen Bo, MD 03/27/19 1801

## 2019-03-27 NOTE — ED Triage Notes (Signed)
Pt c/o left sided toothache x 2 weeks.

## 2019-03-27 NOTE — Discharge Instructions (Addendum)
Take penicillin as prescribed and complete the full course. Rinse with Listerine after every meal.   Take 600 mg of Advil liquid gels and 500 mg Tylenol every 6 hours for pain.  Follow-up with a dentist as soon as possible, you have been referred to Dr. Haig Prophet, call the office today to schedule an appointment.  There is also an attached list of area tenderness.

## 2020-04-12 ENCOUNTER — Encounter (HOSPITAL_COMMUNITY): Payer: Self-pay | Admitting: Emergency Medicine

## 2020-04-12 ENCOUNTER — Emergency Department (HOSPITAL_COMMUNITY)
Admission: EM | Admit: 2020-04-12 | Discharge: 2020-04-12 | Disposition: A | Discharge status: Home / Self Care | Payer: Self-pay | Attending: Emergency Medicine | Admitting: Emergency Medicine

## 2020-04-12 ENCOUNTER — Other Ambulatory Visit: Payer: Self-pay

## 2020-04-12 DIAGNOSIS — Z0289 Encounter for other administrative examinations: Secondary | ICD-10-CM

## 2020-04-12 DIAGNOSIS — Z0279 Encounter for issue of other medical certificate: Secondary | ICD-10-CM | POA: Insufficient documentation

## 2020-04-12 DIAGNOSIS — F17210 Nicotine dependence, cigarettes, uncomplicated: Secondary | ICD-10-CM | POA: Insufficient documentation

## 2020-04-12 NOTE — Clinical Social Work Note (Signed)
CSW visited pt in ED due to pt having no insurance or PCP. CSW explained [facility] and referral process to pt. Pt does not want referral made to [facility]. TOC signing off.

## 2020-04-12 NOTE — ED Provider Notes (Signed)
[facility] EMERGENCY DEPARTMENT Provider Note   CSN: [id number] Arrival date & time: 04/12/20  1905     History Chief Complaint  Patient presents with  . work note    Brent Anderson is a 29 y.o. male without significant past medical history, presenting to the emergency department for a work note.  Patient states last week he had a syncopal episode at work which he attributes to dehydration and not eating throughout the day.  He states his syncope was preceded by lightheadedness, he felt well after the fall.  No head trauma.  Denies palpitations or chest pain prior to the syncope.  He has not had any recurrence.  He was evaluated by EMS when that happened, however did not get transferred and went home to rest.  He felt much better after eating and drinking.  He has no current complaints, requests work note to return to work tonight as his employer will not allow him to return without an excuse.  He does not have a PCP.  The history is provided by the patient.       History reviewed. No pertinent past medical history.  There are no problems to display for this patient.   Past Surgical History:  Procedure Laterality Date  . TONSILLECTOMY         History reviewed. No pertinent family history.  Social History   Tobacco Use  . Smoking status: Current Every Day Smoker    Packs/day: 0.50    Types: Cigarettes  . Smokeless tobacco: Never Used  Vaping Use  . Vaping Use: Never used  Substance Use Topics  . Alcohol use: No  . Drug use: Yes    Types: Marijuana    Home Medications Prior to Admission medications   Medication Sig Start Date End Date Taking? Authorizing Provider  cyclobenzaprine (FLEXERIL) 10 MG tablet Take 1 tablet (10 mg total) by mouth 2 (two) times daily as needed for muscle spasms. 08/12/16   Janne Napoleon, NP  dexamethasone (DECADRON) 4 MG tablet Take 1 tablet (4 mg total) by mouth 2 (two) times daily with a meal. 11/27/15   Ivery Quale, PA-C    hydrOXYzine (VISTARIL) 25 MG capsule Take 1 capsule (25 mg total) by mouth 3 (three) times daily as needed. 11/27/15   Ivery Quale, PA-C  ibuprofen (ADVIL,MOTRIN) 600 MG tablet Take 1 tablet (600 mg total) by mouth every 6 (six) hours as needed. 08/12/16   Janne Napoleon, NP    Allergies    Patient has no known allergies.  Review of Systems   Review of Systems  All other systems reviewed and are negative.   Physical Exam Updated Vital Signs BP 123/81 (BP Location: Right Arm)   Pulse 72   Temp 98.3 F (36.8 C) (Oral)   Resp 17   Ht 5\' 7"  (1.702 m)   Wt 70.3 kg   SpO2 100%   BMI 24.28 kg/m   Physical Exam Vitals and nursing note reviewed.  Constitutional:      General: He is not in acute distress.    Appearance: He is well-developed. He is not ill-appearing.  HENT:     Head: Normocephalic and atraumatic.  Eyes:     Conjunctiva/sclera: Conjunctivae normal.     Pupils: Pupils are equal, round, and reactive to light.  Cardiovascular:     Rate and Rhythm: Normal rate and regular rhythm.  Pulmonary:     Effort: Pulmonary effort is normal.     Breath  sounds: Normal breath sounds.  Neurological:     Mental Status: He is alert.  Psychiatric:        Mood and Affect: Mood normal.        Behavior: Behavior normal.     ED Results / Procedures / Treatments   Labs (all labs ordered are listed, but only abnormal results are displayed) Labs Reviewed - No data to display  EKG None  Radiology No results found.  Procedures Procedures (including critical care time)  Medications Ordered in ED Medications - No data to display  ED Course  I have reviewed the triage vital signs and the nursing notes.  Pertinent labs & imaging results that were available during my care of the patient were reviewed by me and considered in my medical decision making (see chart for details).    MDM Rules/Calculators/A&P                          Patient presented to the ED for work note to  return to work tonight after having a syncopal episode last week.  He admits he did not eat or drink anything throughout the day prior to the syncope which had a prodrome of lightheadedness.  No injuries from the syncope.  No recurrence and he felt much better after eating and drinking.  He was evaluated by EMS, however did not get transferred to the ED.  He has no current complaints.  On exam he is well-appearing stable vital signs.  Patient provided work note and encouraged he establish PCP.  Provided referral and discussed return precautions.  Patient verbalized understanding and agrees with care plan.   Final Clinical Impression(s) / ED Diagnoses Final diagnoses:  Encounter to obtain excuse from work    Rx / DC Orders ED Discharge Orders    None       Tacey Dimaggio, [place] N, PA-C 04/12/20 2007    Gerhard Munch, MD 04/12/20 2317

## 2020-04-12 NOTE — Discharge Instructions (Signed)
Please establish primary care. Return if you have recurrent symptoms.

## 2020-04-12 NOTE — ED Triage Notes (Signed)
Pt reports he was seen by EMS at work last week for a syncopal episode; he was not transferred by EMS but left work and went home, pt reports he was absent from work for 3 days; he reported to work today and his employer told him he could not work until he has a Loss adjuster, chartered note stating he his medically clear to work, pt reports he does not need a work excuse for missed days last week; no medical complaints today

## 2020-05-12 ENCOUNTER — Other Ambulatory Visit: Payer: Self-pay

## 2020-05-12 ENCOUNTER — Emergency Department (HOSPITAL_COMMUNITY)
Admission: EM | Admit: 2020-05-12 | Discharge: 2020-05-12 | Disposition: A | Discharge status: Home / Self Care | Payer: Self-pay | Attending: Emergency Medicine | Admitting: Emergency Medicine

## 2020-05-12 ENCOUNTER — Encounter (HOSPITAL_COMMUNITY): Payer: Self-pay | Admitting: Emergency Medicine

## 2020-05-12 DIAGNOSIS — S01112A Laceration without foreign body of left eyelid and periocular area, initial encounter: Secondary | ICD-10-CM | POA: Insufficient documentation

## 2020-05-12 DIAGNOSIS — Z5321 Procedure and treatment not carried out due to patient leaving prior to being seen by health care provider: Secondary | ICD-10-CM | POA: Insufficient documentation

## 2020-05-12 HISTORY — DX: Bronchitis, not specified as acute or chronic: J40

## 2020-05-12 HISTORY — DX: Unspecified asthma, uncomplicated: J45.909
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
## 2020-06-05 ENCOUNTER — Other Ambulatory Visit: Payer: Self-pay

## 2020-06-05 ENCOUNTER — Encounter (HOSPITAL_COMMUNITY): Payer: Self-pay | Admitting: Emergency Medicine

## 2020-06-05 ENCOUNTER — Emergency Department (HOSPITAL_COMMUNITY)
Admission: EM | Admit: 2020-06-05 | Discharge: 2020-06-05 | Disposition: A | Discharge status: Home / Self Care | Payer: HRSA Program | Attending: Emergency Medicine | Admitting: Emergency Medicine

## 2020-06-05 DIAGNOSIS — J45909 Unspecified asthma, uncomplicated: Secondary | ICD-10-CM | POA: Diagnosis not present

## 2020-06-05 DIAGNOSIS — R509 Fever, unspecified: Secondary | ICD-10-CM | POA: Diagnosis present

## 2020-06-05 DIAGNOSIS — F17210 Nicotine dependence, cigarettes, uncomplicated: Secondary | ICD-10-CM | POA: Insufficient documentation

## 2020-06-05 DIAGNOSIS — U071 COVID-19: Secondary | ICD-10-CM | POA: Diagnosis not present

## 2020-06-05 LAB — URINALYSIS, ROUTINE W REFLEX MICROSCOPIC
Bilirubin Urine: NEGATIVE
Glucose, UA: NEGATIVE mg/dL
Ketones, ur: 5 mg/dL — AB
Leukocytes,Ua: NEGATIVE
Nitrite: NEGATIVE
Protein, ur: 100 mg/dL — AB
Specific Gravity, Urine: 1.027 (ref 1.005–1.030)
pH: 5 (ref 5.0–8.0)

## 2020-06-05 LAB — POC SARS CORONAVIRUS 2 AG -  ED: SARS Coronavirus 2 Ag: POSITIVE — AB

## 2020-06-05 MED ORDER — ACETAMINOPHEN 325 MG PO TABS
650.0000 mg | ORAL_TABLET | Freq: Once | ORAL | Status: AC
  Administered 2020-06-05: 650 mg via ORAL
  Filled 2020-06-05: qty 2

## 2020-06-05 MED ORDER — ONDANSETRON 4 MG PO TBDP
4.0000 mg | ORAL_TABLET | Freq: Once | ORAL | Status: AC
  Administered 2020-06-05: 4 mg via ORAL
  Filled 2020-06-05: qty 1

## 2020-06-05 MED ORDER — ONDANSETRON 4 MG PO TBDP: 4.0000 mg | ORAL_TABLET | Freq: Three times a day (TID) | ORAL | 0 refills | Status: DC | PRN

## 2020-06-05 NOTE — ED Notes (Signed)
Drinking ginger ale   No vomiting

## 2020-06-05 NOTE — ED Triage Notes (Signed)
Back pain x 5 days   No meds OTC  No PCP visit/call  Here for eval   Fluid movement

## 2020-06-05 NOTE — Discharge Instructions (Addendum)
You tested positive for COVID-19 today which is the source of your symptoms.  It will be important that you maintain home quarantine for total of 7 days from the onset of your symptoms.  However if you continue to have symptoms beyond the 7-day mark you will need to continue your home quarantine until your symptoms are improved and your Covid recheck test is negative.  Rest to make sure you are drinking plenty of fluids.  I recommend Tylenol or Motrin for fever reduction in headache reduction.  Use the Zofran prescribed to help you with your nausea.  You can obtain a repeat Covid test once you are better at [address] in [facility] [place].  My understanding is they offered drive through Covid testing.  In the interim return here if you have any worsening symptoms, specifically if you develop shortness of breath or severe weakness.

## 2020-06-06 NOTE — ED Provider Notes (Signed)
[facility] EMERGENCY DEPARTMENT Provider Note   CSN: [id number] Arrival date & time: 06/05/20  1047     History Chief Complaint  Patient presents with  . Back Pain    Brent Anderson is a 30 y.o. male with a history of childhood asthma presenting for evaluation of a 2-day history generalized myalgias, fatigue, bilateral back pain, describing aching in his lower back but also across to shoulders.  He also endorses subjective fever and chills intermittently.  He has had no cough or shortness of breath, denies wheezing, chest pain, does have mild nausea, no vomiting, abdominal pain, dysuria or diarrhea.  His cough has been nonproductive.  He presents with other family members with symptoms that are suggestive of possible COVID-19 infection.  He has had no medications for treatment of his symptoms.  He has not been vaccinated for Covid.   He works third shift and was found sleeping at his workstation 2 nights ago, was sent home from work with instructions to get tested for Covid.  HPI     Past Medical History:  Diagnosis Date  . Asthma   . Bronchitis     There are no problems to display for this patient.   Past Surgical History:  Procedure Laterality Date  . TONSILLECTOMY         History reviewed. No pertinent family history.  Social History   Tobacco Use  . Smoking status: Current Every Day Smoker    Packs/day: 0.50    Types: Cigarettes  . Smokeless tobacco: Never Used  Vaping Use  . Vaping Use: Never used  Substance Use Topics  . Alcohol use: No    Comment: occ.  . Drug use: Yes    Frequency: 7.0 times per week    Types: Marijuana    Home Medications Prior to Admission medications   Medication Sig Start Date End Date Taking? Authorizing Provider  ondansetron (ZOFRAN ODT) 4 MG disintegrating tablet Take 1 tablet (4 mg total) by mouth every 8 (eight) hours as needed for nausea or vomiting. 06/05/20  Yes Yanessa Hocevar, Raynelle Fanning, PA-C  cyclobenzaprine (FLEXERIL) 10 MG tablet  Take 1 tablet (10 mg total) by mouth 2 (two) times daily as needed for muscle spasms. 08/12/16   Janne Napoleon, NP  dexamethasone (DECADRON) 4 MG tablet Take 1 tablet (4 mg total) by mouth 2 (two) times daily with a meal. 11/27/15   Ivery Quale, PA-C  hydrOXYzine (VISTARIL) 25 MG capsule Take 1 capsule (25 mg total) by mouth 3 (three) times daily as needed. 11/27/15   Ivery Quale, PA-C  ibuprofen (ADVIL,MOTRIN) 600 MG tablet Take 1 tablet (600 mg total) by mouth every 6 (six) hours as needed. 08/12/16   Janne Napoleon, NP    Allergies    Patient has no known allergies.  Review of Systems   Review of Systems  Constitutional: Positive for chills, fatigue and fever.  HENT: Negative for congestion and sore throat.   Eyes: Negative.   Respiratory: Positive for cough. Negative for chest tightness, shortness of breath and wheezing.   Cardiovascular: Negative for chest pain.  Gastrointestinal: Negative for abdominal pain, diarrhea, nausea and vomiting.  Genitourinary: Negative.   Musculoskeletal: Negative for arthralgias, joint swelling and neck pain.  Skin: Negative.  Negative for rash and wound.  Neurological: Positive for weakness. Negative for dizziness, light-headedness, numbness and headaches.  Psychiatric/Behavioral: Negative.     Physical Exam Updated Vital Signs BP 116/78 (BP Location: Right Arm)   Pulse 71  Temp 99 F (37.2 C) (Oral)   Resp 18   Ht 5\' 7"  (1.702 m)   Wt 70.3 kg   SpO2 99%   BMI 24.27 kg/m   Physical Exam Vitals and nursing note reviewed.  Constitutional:      Appearance: He is well-developed and well-nourished.     Comments: Febrile.  HENT:     Head: Normocephalic and atraumatic.     Nose: Nose normal.     Mouth/Throat:     Mouth: Mucous membranes are moist.     Pharynx: No posterior oropharyngeal erythema.  Eyes:     Conjunctiva/sclera: Conjunctivae normal.  Cardiovascular:     Rate and Rhythm: Normal rate and regular rhythm.     Pulses: Intact  distal pulses.     Heart sounds: Normal heart sounds.  Pulmonary:     Effort: Pulmonary effort is normal. No respiratory distress.     Breath sounds: Normal breath sounds. No wheezing or rhonchi.  Abdominal:     General: Bowel sounds are normal.     Palpations: Abdomen is soft.     Tenderness: There is no abdominal tenderness. There is no guarding.  Musculoskeletal:        General: Normal range of motion.     Cervical back: Normal range of motion. No rigidity.  Lymphadenopathy:     Cervical: No cervical adenopathy.  Skin:    General: Skin is warm and dry.  Neurological:     General: No focal deficit present.     Mental Status: He is alert.  Psychiatric:        Mood and Affect: Mood and affect normal.     ED Results / Procedures / Treatments   Labs (all labs ordered are listed, but only abnormal results are displayed) Labs Reviewed  URINALYSIS, ROUTINE W REFLEX MICROSCOPIC - Abnormal; Notable for the following components:      Result Value   APPearance HAZY (*)    Hgb urine dipstick SMALL (*)    Ketones, ur 5 (*)    Protein, ur 100 (*)    Bacteria, UA FEW (*)    All other components within normal limits  POC SARS CORONAVIRUS 2 AG -  ED - Abnormal; Notable for the following components:   SARS Coronavirus 2 Ag POSITIVE (*)    All other components within normal limits    EKG None  Radiology No results found.  Procedures Procedures (including critical care time)  Medications Ordered in ED Medications  acetaminophen (TYLENOL) tablet 650 mg (650 mg Oral Given 06/05/20 1127)  ondansetron (ZOFRAN-ODT) disintegrating tablet 4 mg (4 mg Oral Given 06/05/20 1150)    ED Course  I have reviewed the triage vital signs and the nursing notes.  Pertinent labs & imaging results that were available during my care of the patient were reviewed by me and considered in my medical decision making (see chart for details).    MDM Rules/Calculators/A&P                          Patient  has tested positive for COVID-19.  He has no complaints of shortness of breath and his vital signs here are stable.  He was given Tylenol and his fever responded appropriately.  He was also given Zofran and his nausea improved.  He was able to tolerate p.o. fluids prior to discharge home.  He was given home instructions for symptomatic treatment, rest, increase fluid intake.  He  was instructed in quarantine protocol, strict return precautions were also outlined for this patient.  Ringo A Luckett was evaluated in Emergency Department on 06/06/2020 for the symptoms described in the history of present illness. He was evaluated in the context of the global COVID-19 pandemic, which necessitated consideration that the patient might be at risk for infection with the SARS-CoV-2 virus that causes COVID-19. Institutional protocols and algorithms that pertain to the evaluation of patients at risk for COVID-19 are in a state of rapid change based on information released by regulatory bodies including the CDC and federal and state organizations. These policies and algorithms were followed during the patient's care in the ED.  Final Clinical Impression(s) / ED Diagnoses Final diagnoses:  COVID-19    Rx / DC Orders ED Discharge Orders         Ordered    ondansetron (ZOFRAN ODT) 4 MG disintegrating tablet  Every 8 hours PRN        06/05/20 1246           Burgess Amor, Cordelia Poche 06/06/20 2016    Jacalyn Lefevre, MD 06/08/20 [phone]

## 2020-12-29 ENCOUNTER — Encounter (HOSPITAL_COMMUNITY): Payer: Self-pay

## 2020-12-29 ENCOUNTER — Emergency Department (HOSPITAL_COMMUNITY)
Admission: EM | Admit: 2020-12-29 | Discharge: 2020-12-29 | Disposition: A | Discharge status: Home / Self Care | Payer: Self-pay | Attending: Emergency Medicine | Admitting: Emergency Medicine

## 2020-12-29 ENCOUNTER — Other Ambulatory Visit: Payer: Self-pay

## 2020-12-29 DIAGNOSIS — U071 COVID-19: Secondary | ICD-10-CM | POA: Insufficient documentation

## 2020-12-29 DIAGNOSIS — J45909 Unspecified asthma, uncomplicated: Secondary | ICD-10-CM | POA: Insufficient documentation

## 2020-12-29 DIAGNOSIS — Z28310 Unvaccinated for covid-19: Secondary | ICD-10-CM | POA: Insufficient documentation

## 2020-12-29 DIAGNOSIS — F17210 Nicotine dependence, cigarettes, uncomplicated: Secondary | ICD-10-CM | POA: Insufficient documentation

## 2020-12-29 LAB — CBC WITH DIFFERENTIAL/PLATELET
Abs Immature Granulocytes: 0.02 10*3/uL (ref 0.00–0.07)
Basophils Absolute: 0.1 10*3/uL (ref 0.0–0.1)
Basophils Relative: 1 %
Eosinophils Absolute: 0 10*3/uL (ref 0.0–0.5)
Eosinophils Relative: 0 %
HCT: 46.6 % (ref 39.0–52.0)
Hemoglobin: 15.2 g/dL (ref 13.0–17.0)
Immature Granulocytes: 0 %
Lymphocytes Relative: 26 %
Lymphs Abs: 1.2 10*3/uL (ref 0.7–4.0)
MCH: 27.9 pg (ref 26.0–34.0)
MCHC: 32.6 g/dL (ref 30.0–36.0)
MCV: 85.5 fL (ref 80.0–100.0)
Monocytes Absolute: 0.6 10*3/uL (ref 0.1–1.0)
Monocytes Relative: 14 %
Neutro Abs: 2.6 10*3/uL (ref 1.7–7.7)
Neutrophils Relative %: 59 %
Platelets: 196 10*3/uL (ref 150–400)
RBC: 5.45 MIL/uL (ref 4.22–5.81)
RDW: 13.6 % (ref 11.5–15.5)
WBC: 4.5 10*3/uL (ref 4.0–10.5)
nRBC: 0 % (ref 0.0–0.2)

## 2020-12-29 LAB — BASIC METABOLIC PANEL
Anion gap: 8 (ref 5–15)
BUN: 8 mg/dL (ref 6–20)
CO2: 27 mmol/L (ref 22–32)
Calcium: 9 mg/dL (ref 8.9–10.3)
Chloride: 103 mmol/L (ref 98–111)
Creatinine, Ser: 1.08 mg/dL (ref 0.61–1.24)
GFR, Estimated: >60 mL/min (ref >60)
Glucose, Bld: 90 mg/dL (ref 70–99)
Potassium: 3.7 mmol/L (ref 3.5–5.1)
Sodium: 138 mmol/L (ref 135–145)

## 2020-12-29 LAB — RESP PANEL BY RT-PCR (FLU A&B, COVID) ARPGX2
Influenza A by PCR: NEGATIVE
Influenza B by PCR: NEGATIVE
SARS Coronavirus 2 by RT PCR: POSITIVE — AB

## 2020-12-29 MED ORDER — ONDANSETRON 8 MG PO TBDP
8.0000 mg | ORAL_TABLET | Freq: Once | ORAL | Status: AC
  Administered 2020-12-29: 8 mg via ORAL
  Filled 2020-12-29: qty 1

## 2020-12-29 MED ORDER — ONDANSETRON HCL 4 MG PO TABS: 4.0000 mg | ORAL_TABLET | Freq: Four times a day (QID) | ORAL | 0 refills | Status: AC

## 2020-12-29 MED ORDER — NIRMATRELVIR/RITONAVIR (PAXLOVID)TABLET: 3.0000 | ORAL_TABLET | Freq: Two times a day (BID) | ORAL | 0 refills | Status: AC

## 2020-12-29 NOTE — Discharge Instructions (Addendum)
You have tested positive for COVID, started on the antiviral treatment please take as prescribed.  will also give you Zofran for nausea please use as needed.  I recommend taking Tylenol for fever control and ibuprofen for pain control please follow dosing on the back of bottle.  I recommend staying hydrated and if you do not an appetite, I recommend soups as this will provide you with fluids and calories.    you are Covid positive you must self quarantine for 5 days starting on symptom onset, if at the end of those 5 days you are feeling better you may return back to school/work, if you continue to have symptoms you must self quarantine for additional 5 days.  I would like you to contact "post Covid care" as they will provide you with information how to manage your Covid symptoms  Come back to the emergency department if you develop chest pain, shortness of breath, severe abdominal pain, uncontrolled nausea, vomiting, diarrhea.

## 2020-12-29 NOTE — ED Notes (Signed)
Pt tolerating oral fluid intake at this time. 

## 2020-12-29 NOTE — ED Triage Notes (Signed)
Pt reports vomiting, back pain, an sleeping difficulty since Monday.  Denies cough.  Denies diarrhea.  LBM  was Monday morning. Reports was around someone with covid Sunday night.

## 2020-12-29 NOTE — ED Notes (Addendum)
Date and time results received: 12/29/20 1135 (use smartphrase ".now" to insert current time)  Test: Covid Critical Value: Positive  Name of Provider Notified: Uvaldo Rising, [place]  Orders Received? Or Actions Taken?:

## 2020-12-29 NOTE — ED Provider Notes (Signed)
[facility] EMERGENCY DEPARTMENT Provider Note   CSN: [id number] Arrival date & time: 12/29/20  1941     History Chief Complaint  Patient presents with   Vomiting    Brent Anderson is a 30 y.o. male.  HPI  Patient with significant medical history of asthma, bronchitis presents to the emergency department with chief complaint of not feeling well.  Patient states this started yesterday morning around 3 AM, states he feels generalized body aches, nausea, vomiting, feeling fatigue, headaches, and a slight cough.  Patient is not vaccine against COVID-19, admits to recent COVID exposures, he is not immunocompromise.  He denies chest pain, shortness of breath, constipation, diarrhea, he denies hematemesis or coffee-ground emesis.  States he has a lack of appetite due to the nausea.  He has no other complaints at this time.  Past Medical History:  Diagnosis Date   Asthma    Bronchitis     There are no problems to display for this patient.   Past Surgical History:  Procedure Laterality Date   TONSILLECTOMY         No family history on file.  Social History   Tobacco Use   Smoking status: Every Day    Packs/day: 0.50    Types: Cigarettes   Smokeless tobacco: Never  Vaping Use   Vaping Use: Never used  Substance Use Topics   Alcohol use: No    Comment: occ.   Drug use: Yes    Frequency: 7.0 times per week    Types: Marijuana    Home Medications Prior to Admission medications   Medication Sig Start Date End Date Taking? Authorizing Provider  nirmatrelvir/ritonavir EUA (PAXLOVID) TABS Take 3 tablets by mouth 2 (two) times daily for 5 days. Take nirmatrelvir (150 mg) two tablets twice daily for 5 days and ritonavir (100 mg) one tablet twice daily for 5 days. 12/29/20 01/03/21 Yes Carroll Sage, PA-C  ondansetron (ZOFRAN) 4 MG tablet Take 1 tablet (4 mg total) by mouth every 6 (six) hours. 12/29/20  Yes Carroll Sage, PA-C  cyclobenzaprine (FLEXERIL) 10 MG tablet  Take 1 tablet (10 mg total) by mouth 2 (two) times daily as needed for muscle spasms. 08/12/16   Janne Napoleon, NP  dexamethasone (DECADRON) 4 MG tablet Take 1 tablet (4 mg total) by mouth 2 (two) times daily with a meal. 11/27/15   Ivery Quale, PA-C  hydrOXYzine (VISTARIL) 25 MG capsule Take 1 capsule (25 mg total) by mouth 3 (three) times daily as needed. 11/27/15   Ivery Quale, PA-C  ibuprofen (ADVIL,MOTRIN) 600 MG tablet Take 1 tablet (600 mg total) by mouth every 6 (six) hours as needed. 08/12/16   Janne Napoleon, NP  ondansetron (ZOFRAN ODT) 4 MG disintegrating tablet Take 1 tablet (4 mg total) by mouth every 8 (eight) hours as needed for nausea or vomiting. 06/05/20   Burgess Amor, PA-C    Allergies    Patient has no known allergies.  Review of Systems   Review of Systems  Constitutional:  Positive for fatigue. Negative for chills and fever.  HENT:  Negative for congestion.   Respiratory:  Positive for cough. Negative for shortness of breath.   Cardiovascular:  Negative for chest pain.  Gastrointestinal:  Positive for nausea and vomiting. Negative for abdominal pain and diarrhea.  Genitourinary:  Negative for enuresis.  Musculoskeletal:  Positive for myalgias. Negative for back pain.  Skin:  Negative for rash.  Neurological:  Positive for headaches. Negative for  dizziness.  Hematological:  Does not bruise/bleed easily.   Physical Exam Updated Vital Signs BP 131/79   Pulse 82   Temp 99.2 F (37.3 C) (Oral)   Resp 16   Ht 5\' 8"  (1.727 m)   Wt 74.8 kg   SpO2 99%   BMI 25.09 kg/m   Physical Exam Vitals and nursing note reviewed.  Constitutional:      General: He is not in acute distress.    Appearance: He is not ill-appearing.  HENT:     Head: Normocephalic and atraumatic.     Right Ear: Tympanic membrane, ear canal and external ear normal.     Left Ear: Tympanic membrane, ear canal and external ear normal.     Nose: No congestion or rhinorrhea.     Mouth/Throat:      Mouth: Mucous membranes are moist.     Pharynx: Oropharynx is clear. No oropharyngeal exudate or posterior oropharyngeal erythema.  Eyes:     Conjunctiva/sclera: Conjunctivae normal.  Cardiovascular:     Rate and Rhythm: Normal rate and regular rhythm.     Pulses: Normal pulses.     Heart sounds: No murmur heard.   No friction rub. No gallop.  Pulmonary:     Effort: No respiratory distress.     Breath sounds: No wheezing, rhonchi or rales.  Abdominal:     Palpations: Abdomen is soft.     Tenderness: There is no abdominal tenderness. There is no right CVA tenderness or left CVA tenderness.  Musculoskeletal:     Right lower leg: No edema.     Left lower leg: No edema.  Skin:    General: Skin is warm and dry.  Neurological:     Mental Status: He is alert.  Psychiatric:        Mood and Affect: Mood normal.    ED Results / Procedures / Treatments   Labs (all labs ordered are listed, but only abnormal results are displayed) Labs Reviewed  RESP PANEL BY RT-PCR (FLU A&B, COVID) ARPGX2 - Abnormal; Notable for the following components:      Result Value   SARS Coronavirus 2 by RT PCR POSITIVE (*)    All other components within normal limits  CBC WITH DIFFERENTIAL/PLATELET  BASIC METABOLIC PANEL    EKG None  Radiology No results found.  Procedures Procedures   Medications Ordered in ED Medications  ondansetron (ZOFRAN-ODT) disintegrating tablet 8 mg (8 mg Oral Given 12/29/20 1133)    ED Course  I have reviewed the triage vital signs and the nursing notes.  Pertinent labs & imaging results that were available during my care of the patient were reviewed by me and considered in my medical decision making (see chart for details).    MDM Rules/Calculators/A&P                          Initial impression-patient presents with URI-like symptoms he is alert, does not appear in acute stress, vital signs reassuring.  Suspect he has a viral URI, will obtain respiratory panel, add  on CMP, CBC provide him with Zofran and p.o. challenge.  Work-up-CBC negative, BMP negative, respiratory panel positive for COVID  Reassessment-patient reassessed, continues have no complaints this time, tolerating p.o.  Patient is agreeable for discharge at this time.  Rule out- Low suspicion for systemic infection as patient is nontoxic-appearing, vital signs reassuring, no obvious source infection noted on exam.  Low suspicion for pneumonia as lung  sounds are clear bilaterally, x-ray did not reveal any acute findings.  I have low suspicion for PE as patient denies pleuritic chest pain, shortness of breath, patient is PERC. low suspicion for strep throat as oropharynx was visualized, no erythema or exudates noted.  Low suspicion patient would need  hospitalized due to viral infection or Covid as vital signs reassuring, patient is not in respiratory distress.    Plan-  URI--suspect likely from COVID, patient is at increased risk for an adverse outcome due to his unvaccinated state, will start him on antivirals, will also provide him with antiemetics, follow-up with post-COVID care for further evaluation.  Vital signs have remained stable, no indication for hospital admission.   Patient given at home care as well strict return precautions.  Patient verbalized that they understood agreed to said plan.  Final Clinical Impression(s) / ED Diagnoses Final diagnoses:  COVID    Rx / DC Orders ED Discharge Orders          Ordered    nirmatrelvir/ritonavir EUA (PAXLOVID) TABS  2 times daily        12/29/20 1345    ondansetron (ZOFRAN) 4 MG tablet  Every 6 hours        12/29/20 1345             Barnie Del 12/29/20 1347    Pollyann Savoy, MD 12/29/20 [phone]

## 2021-02-27 ENCOUNTER — Encounter (HOSPITAL_COMMUNITY): Payer: Self-pay | Admitting: Emergency Medicine

## 2021-02-27 ENCOUNTER — Emergency Department (HOSPITAL_COMMUNITY)
Admission: EM | Admit: 2021-02-27 | Discharge: 2021-02-27 | Disposition: A | Discharge status: Home / Self Care | Payer: Self-pay | Attending: Emergency Medicine | Admitting: Emergency Medicine

## 2021-02-27 ENCOUNTER — Other Ambulatory Visit: Payer: Self-pay

## 2021-02-27 ENCOUNTER — Emergency Department (HOSPITAL_COMMUNITY): Payer: Self-pay

## 2021-02-27 DIAGNOSIS — F17210 Nicotine dependence, cigarettes, uncomplicated: Secondary | ICD-10-CM | POA: Insufficient documentation

## 2021-02-27 DIAGNOSIS — R0789 Other chest pain: Secondary | ICD-10-CM

## 2021-02-27 DIAGNOSIS — R0602 Shortness of breath: Secondary | ICD-10-CM | POA: Insufficient documentation

## 2021-02-27 DIAGNOSIS — J45909 Unspecified asthma, uncomplicated: Secondary | ICD-10-CM | POA: Insufficient documentation

## 2021-02-27 DIAGNOSIS — M940 Chondrocostal junction syndrome [Tietze]: Secondary | ICD-10-CM | POA: Insufficient documentation

## 2021-02-27 MED ORDER — IBUPROFEN 400 MG PO TABS
600.0000 mg | ORAL_TABLET | Freq: Once | ORAL | Status: AC
  Administered 2021-02-27: 600 mg via ORAL
  Filled 2021-02-27: qty 2

## 2021-02-27 NOTE — ED Provider Notes (Signed)
[facility] EMERGENCY DEPARTMENT Provider Note   CSN: [id number] Arrival date & time: 02/27/21  1707     History Chief Complaint  Patient presents with   Chest Pain    Brent Anderson is a 30 y.o. male.  30 year old male presents today for evaluation of 1 month duration of right-sided progressively worsening chest pain.  He reports he works in the [employer].  He reports a month ago he noticed a throbbing in the right side of his chest that lasted for few minutes associated with difficulty taking in a deep breath.  He reports that after it resolved and has not had much issue with it up until 2 weeks ago around the time it started raining.  He reports since then has been progressively worsening and 3 days ago became significantly worse limiting him from smoking because of the pleuritic chest pain.  He reports rotational movements are also quite painful, along with deep breathing, or palpation.  He denies recent air travel road trip or prolonged sedentary period.  He denies nausea, lack of appetite, pain in either of his legs, hemoptysis.  He has not tried anything over-the-counter including Tylenol or ibuprofen.  The history is provided by the patient. No language interpreter was used.      Past Medical History:  Diagnosis Date   Asthma    Bronchitis     There are no problems to display for this patient.   Past Surgical History:  Procedure Laterality Date   TONSILLECTOMY         No family history on file.  Social History   Tobacco Use   Smoking status: Every Day    Packs/day: 0.50    Types: Cigarettes   Smokeless tobacco: Never  Vaping Use   Vaping Use: Never used  Substance Use Topics   Alcohol use: No    Comment: occ.   Drug use: Yes    Frequency: 7.0 times per week    Types: Marijuana    Home Medications Prior to Admission medications   Medication Sig Start Date End Date Taking? Authorizing Provider  cyclobenzaprine (FLEXERIL) 10 MG  tablet Take 1 tablet (10 mg total) by mouth 2 (two) times daily as needed for muscle spasms. 08/12/16   Janne Napoleon, NP  dexamethasone (DECADRON) 4 MG tablet Take 1 tablet (4 mg total) by mouth 2 (two) times daily with a meal. 11/27/15   Ivery Quale, PA-C  hydrOXYzine (VISTARIL) 25 MG capsule Take 1 capsule (25 mg total) by mouth 3 (three) times daily as needed. 11/27/15   Ivery Quale, PA-C  ibuprofen (ADVIL,MOTRIN) 600 MG tablet Take 1 tablet (600 mg total) by mouth every 6 (six) hours as needed. 08/12/16   Janne Napoleon, NP  ondansetron (ZOFRAN ODT) 4 MG disintegrating tablet Take 1 tablet (4 mg total) by mouth every 8 (eight) hours as needed for nausea or vomiting. 06/05/20   Burgess Amor, PA-C  ondansetron (ZOFRAN) 4 MG tablet Take 1 tablet (4 mg total) by mouth every 6 (six) hours. 12/29/20   Carroll Sage, PA-C    Allergies    Patient has no known allergies.  Review of Systems   Review of Systems  Constitutional:  Positive for activity change. Negative for appetite change, chills, diaphoresis, fever and unexpected weight change.  Respiratory:  Negative for chest tightness and shortness of breath.   Cardiovascular:  Positive for chest pain (right sided). Negative for palpitations.  Gastrointestinal:  Negative for abdominal pain,  nausea and vomiting.  Skin:  Negative for rash and wound.  All other systems reviewed and are negative.  Physical Exam Updated Vital Signs BP (!) 143/82 (BP Location: Right Arm)   Pulse 88   Temp 98.2 F (36.8 C) (Oral)   Resp 15   SpO2 100%   Physical Exam Vitals and nursing note reviewed.  Constitutional:      General: He is not in acute distress.    Appearance: Normal appearance. He is not ill-appearing.  HENT:     Head: Normocephalic and atraumatic.     Nose: Nose normal.  Eyes:     Conjunctiva/sclera: Conjunctivae normal.  Cardiovascular:     Rate and Rhythm: Normal rate and regular rhythm.  Pulmonary:     Effort: Pulmonary effort is  normal. No respiratory distress.     Breath sounds: Normal breath sounds. No decreased breath sounds, wheezing, rhonchi or rales.  Chest:       Comments: Point tender over the highlighted parasternal area above. Abdominal:     General: There is no distension.     Palpations: Abdomen is soft.     Tenderness: There is no abdominal tenderness. There is no guarding.  Musculoskeletal:        General: No deformity.     Cervical back: Normal range of motion.  Skin:    Findings: No rash.  Neurological:     Mental Status: He is alert.    ED Results / Procedures / Treatments   Labs (all labs ordered are listed, but only abnormal results are displayed) Labs Reviewed - No data to display  EKG None  Radiology DG Chest 2 View  Result Date: 02/27/2021 CLINICAL DATA:  Chest pain for 1 month, worse with movement. EXAM: CHEST - 2 VIEW COMPARISON:  None FINDINGS: The heart size and mediastinal contours are within normal limits. Both lungs are clear. The visualized skeletal structures are unremarkable. IMPRESSION: No active cardiopulmonary disease. Electronically Signed   By: Annia Belt M.D.   On: 02/27/2021 17:58    Procedures Procedures   Medications Ordered in ED Medications  ibuprofen (ADVIL) tablet 600 mg (has no administration in time range)    ED Course  I have reviewed the triage vital signs and the nursing notes.  Pertinent labs & imaging results that were available during my care of the patient were reviewed by me and considered in my medical decision making (see chart for details).    MDM Rules/Calculators/A&P                           30 year old male presents today for evaluation of right-sided chest pain of 1 month duration.  Reproducible on exam with palpation, deep breathing, and rotating his chest.  Chest x-ray without acute cardiopulmonary process.  EKG unremarkable.  Low likelihood for ACS origin or PE.  Patient with recent COVID infection in August.  This could be  costochondritis given the recent URI. Discussed symptomatic treatment with ibupfrofen.  Patient voices understanding and is in agreement with plan.  Patient is appropriate for discharge.  Final Clinical Impression(s) / ED Diagnoses Final diagnoses:  None    Rx / DC Orders ED Discharge Orders     None        Marita [place], PA-C 02/27/21 1904    Eber Hong, MD 02/28/21 [phone]

## 2021-02-27 NOTE — Discharge Instructions (Addendum)
Your chest x-ray was normal.  Your EKG was normal.  Your chest pain is musculoskeletal in nature and advised you to take ibuprofen 600 mg 3 times a day for 7 days.  Establish care with a primary care provider.

## 2021-02-27 NOTE — ED Provider Notes (Signed)
This patient is a well-appearing 30 year old male presenting with chest pain which is just right of the parasternal area at the mid to lower sternal area.  This is started approximately 2 weeks ago, seems to be intermittent worse with deep breathing and palpation of the chest.  It is not exertional.  EKG is unremarkable, chest x-ray is unremarkable, the patient did have COVID approximately 2 months ago.  At this time I think this is likely more of a costochondritis than anything cardiac or pulmonary.  He has very low risk for pulmonary embolism or acute coronary syndrome.  Stable for discharge at this time.  Patient agreeable  Medical screening examination/treatment/procedure(s) were conducted as a shared visit with non-physician practitioner(s) and myself.  I personally evaluated the patient during the encounter.  Clinical Impression:   Final diagnoses:  None       Eber Hong, MD 02/28/21 [phone]

## 2021-02-27 NOTE — ED Triage Notes (Signed)
Pt reports chest pain x 1 month that is worse with movement. Pt states that it is painful to take a deep breath.

## 2023-07-06 ENCOUNTER — Other Ambulatory Visit: Payer: Self-pay

## 2023-07-06 ENCOUNTER — Emergency Department (HOSPITAL_COMMUNITY)
Admission: EM | Admit: 2023-07-06 | Discharge: 2023-07-06 | Disposition: A | Discharge status: Home / Self Care | Payer: Self-pay | Attending: Emergency Medicine | Admitting: Emergency Medicine

## 2023-07-06 DIAGNOSIS — J45909 Unspecified asthma, uncomplicated: Secondary | ICD-10-CM | POA: Insufficient documentation

## 2023-07-06 DIAGNOSIS — J101 Influenza due to other identified influenza virus with other respiratory manifestations: Secondary | ICD-10-CM | POA: Insufficient documentation

## 2023-07-06 DIAGNOSIS — Z20822 Contact with and (suspected) exposure to covid-19: Secondary | ICD-10-CM | POA: Insufficient documentation

## 2023-07-06 DIAGNOSIS — F17210 Nicotine dependence, cigarettes, uncomplicated: Secondary | ICD-10-CM | POA: Insufficient documentation

## 2023-07-06 LAB — RESP PANEL BY RT-PCR (RSV, FLU A&B, COVID)  RVPGX2
Influenza A by PCR: POSITIVE — AB
Influenza B by PCR: NEGATIVE
Resp Syncytial Virus by PCR: NEGATIVE
SARS Coronavirus 2 by RT PCR: NEGATIVE

## 2023-07-06 MED ORDER — ONDANSETRON 4 MG PO TBDP: 4.0000 mg | ORAL_TABLET | Freq: Three times a day (TID) | ORAL | 0 refills | Status: AC | PRN

## 2023-07-06 MED ORDER — NAPROXEN 500 MG PO TABS: 500.0000 mg | ORAL_TABLET | Freq: Two times a day (BID) | ORAL | 0 refills | Status: AC

## 2023-07-06 MED ORDER — METHOCARBAMOL 500 MG PO TABS: 500.0000 mg | ORAL_TABLET | Freq: Three times a day (TID) | ORAL | 0 refills | Status: DC | PRN

## 2023-07-06 NOTE — ED Provider Notes (Signed)
 [facility]-EMERGENCY DEPT [facility] Emergency Department Provider Note MRN:  [id number]  Arrival date & time: 07/06/23     Chief Complaint   flu like symptoms   History of Present Illness   Brent Anderson is a 33 y.o. year-old male with a history of asthma presenting to the ED with chief complaint of flulike symptoms.  Body aches, malaise, chills, cough, headache symptoms for 3 days.  Review of Systems  A thorough review of systems was obtained and all systems are negative except as noted in the HPI and PMH.   Patient's Health History    Past Medical History:  Diagnosis Date   Asthma    Bronchitis     Past Surgical History:  Procedure Laterality Date   TONSILLECTOMY      No family history on file.  Social History   Socioeconomic History   Marital status: Single    Spouse name: Not on file   Number of children: Not on file   Years of education: Not on file   Highest education level: Not on file  Occupational History   Not on file  Tobacco Use   Smoking status: Every Day    Current packs/day: 0.50    Types: Cigarettes   Smokeless tobacco: Never  Vaping Use   Vaping status: Never Used  Substance and Sexual Activity   Alcohol use: No    Comment: occ.   Drug use: Yes    Frequency: 7.0 times per week    Types: Marijuana   Sexual activity: Not on file  Other Topics Concern   Not on file  Social History Narrative   Not on file   Social Drivers of Health   Financial Resource Strain: Not on file  Food Insecurity: Not on file  Transportation Needs: Not on file  Physical Activity: Not on file  Stress: Not on file  Social Connections: Not on file  Intimate Partner Violence: Not on file     Physical Exam   Vitals:   07/06/23 0206  BP: 119/86  Pulse: 83  Resp: 15  Temp: 99.1 F (37.3 C)  SpO2: 98%    CONSTITUTIONAL: Well-appearing, NAD NEURO/PSYCH:  Alert and oriented x 3, no focal deficits EYES:  eyes equal and reactive ENT/NECK:  no LAD, no  JVD CARDIO: Regular rate, well-perfused, normal S1 and S2 PULM:  CTAB no wheezing or rhonchi GI/GU:  non-distended, non-tender MSK/SPINE:  No gross deformities, no edema SKIN:  no rash, atraumatic   *Additional and/or pertinent findings included in MDM below  Diagnostic and Interventional Summary    EKG Interpretation Date/Time:    Ventricular Rate:    PR Interval:    QRS Duration:    QT Interval:    QTC Calculation:   R Axis:      Text Interpretation:         Labs Reviewed  RESP PANEL BY RT-PCR (RSV, FLU A&B, COVID)  RVPGX2 - Abnormal; Notable for the following components:      Result Value   Influenza A by PCR POSITIVE (*)    All other components within normal limits    No orders to display    Medications - No data to display   Procedures  /  Critical Care Procedures  ED Course and Medical Decision Making  Initial Impression and Ddx Symptoms suggestive of viral illness.  Patient has tested positive for influenza here in the emergency department.  Vital signs are reassuring, sitting comfortably no acute distress, lungs  clear, abdomen soft, no meningismus, doubt emergent process.  Past medical/surgical history that increases complexity of ED encounter: None  Interpretation of Diagnostics Flu positive  Patient Reassessment and Ultimate Disposition/Management     Discharge  Patient management required discussion with the following services or consulting groups:  None  Complexity of Problems Addressed Acute complicated illness or Injury  Additional Data Reviewed and Analyzed Further history obtained from: None  Additional Factors Impacting ED Encounter Risk Prescriptions  Ozell HERO. Theadore, MD [facility] Emergency Medicine [facility] [email or web address]  Final Clinical Impressions(s) / ED Diagnoses     ICD-10-CM   1. Influenza A  J10.1       ED Discharge Orders          Ordered    naproxen  (NAPROSYN ) 500 MG tablet  2 times  daily        07/06/23 0458    methocarbamol  (ROBAXIN ) 500 MG tablet  Every 8 hours PRN        07/06/23 0458    ondansetron  (ZOFRAN -ODT) 4 MG disintegrating tablet  Every 8 hours PRN        07/06/23 0458             Discharge Instructions Discussed with and Provided to Patient:    Discharge Instructions      You were evaluated in the Emergency Department and after careful evaluation, we did not find any emergent condition requiring admission or further testing in the hospital.  Your exam/testing today is overall reassuring.  Symptoms seem to be due to the flu.  You tested positive here in the emergency department.  Recommend plenty of fluids and rest, use the Naprosyn  twice daily for aches and pains, use the Zofran  as needed for nausea, can use the Robaxin  muscle relaxer for more significant pain.  Please return to the Emergency Department if you experience any worsening of your condition.   Thank you for allowing us  to be a part of your care.      Theadore Ozell HERO, MD 07/06/23 0500

## 2023-07-06 NOTE — Discharge Instructions (Signed)
 You were evaluated in the Emergency Department and after careful evaluation, we did not find any emergent condition requiring admission or further testing in the hospital.  Your exam/testing today is overall reassuring.  Symptoms seem to be due to the flu.  You tested positive here in the emergency department.  Recommend plenty of fluids and rest, use the Naprosyn  twice daily for aches and pains, use the Zofran  as needed for nausea, can use the Robaxin  muscle relaxer for more significant pain.  Please return to the Emergency Department if you experience any worsening of your condition.   Thank you for allowing us  to be a part of your care.

## 2023-07-06 NOTE — ED Triage Notes (Signed)
 Patient from home for cough, general weakness, and fevers for 3 days. Upon arrival to ER, patient is alert and oriented, ambu

## 2023-10-02 ENCOUNTER — Emergency Department (HOSPITAL_COMMUNITY): Payer: Self-pay

## 2023-10-02 ENCOUNTER — Emergency Department (HOSPITAL_COMMUNITY)
Admission: EM | Admit: 2023-10-02 | Discharge: 2023-10-03 | Disposition: A | Discharge status: Home / Self Care | Payer: Self-pay | Attending: Emergency Medicine | Admitting: Emergency Medicine

## 2023-10-02 ENCOUNTER — Other Ambulatory Visit: Payer: Self-pay

## 2023-10-02 ENCOUNTER — Encounter (HOSPITAL_COMMUNITY): Payer: Self-pay

## 2023-10-02 DIAGNOSIS — T07XXXA Unspecified multiple injuries, initial encounter: Secondary | ICD-10-CM

## 2023-10-02 DIAGNOSIS — R42 Dizziness and giddiness: Secondary | ICD-10-CM | POA: Insufficient documentation

## 2023-10-02 DIAGNOSIS — S301XXA Contusion of abdominal wall, initial encounter: Secondary | ICD-10-CM | POA: Insufficient documentation

## 2023-10-02 DIAGNOSIS — R079 Chest pain, unspecified: Secondary | ICD-10-CM | POA: Insufficient documentation
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
## 2023-10-03 LAB — CBC WITH DIFFERENTIAL/PLATELET
Abs Immature Granulocytes: 0.02 10*3/uL (ref 0.00–0.07)
Basophils Absolute: 0 10*3/uL (ref 0.0–0.1)
Basophils Relative: 1 %
Eosinophils Absolute: 0.1 10*3/uL (ref 0.0–0.5)
Eosinophils Relative: 2 %
HCT: 42.5 % (ref 39.0–52.0)
Hemoglobin: 13.8 g/dL (ref 13.0–17.0)
Immature Granulocytes: 0 %
Lymphocytes Relative: 33 %
Lymphs Abs: 1.8 10*3/uL (ref 0.7–4.0)
MCH: 27.4 pg (ref 26.0–34.0)
MCHC: 32.5 g/dL (ref 30.0–36.0)
MCV: 84.5 fL (ref 80.0–100.0)
Monocytes Absolute: 0.3 10*3/uL (ref 0.1–1.0)
Monocytes Relative: 6 %
Neutro Abs: 3.2 10*3/uL (ref 1.7–7.7)
Neutrophils Relative %: 58 %
Platelets: 204 10*3/uL (ref 150–400)
RBC: 5.03 MIL/uL (ref 4.22–5.81)
RDW: 13.2 % (ref 11.5–15.5)
WBC: 5.5 10*3/uL (ref 4.0–10.5)
nRBC: 0 % (ref 0.0–0.2)

## 2023-10-03 LAB — BASIC METABOLIC PANEL WITH GFR
Anion gap: 7 (ref 5–15)
BUN: 5 mg/dL — ABNORMAL LOW (ref 6–20)
CO2: 27 mmol/L (ref 22–32)
Calcium: 9.1 mg/dL (ref 8.9–10.3)
Chloride: 102 mmol/L (ref 98–111)
Creatinine, Ser: 1.09 mg/dL (ref 0.61–1.24)
GFR, Estimated: >60 mL/min (ref >60)
Glucose, Bld: 91 mg/dL (ref 70–99)
Potassium: 3.8 mmol/L (ref 3.5–5.1)
Sodium: 136 mmol/L (ref 135–145)

## 2023-10-03 MED ORDER — IOHEXOL 300 MG/ML  SOLN
100.0000 mL | Freq: Once | INTRAMUSCULAR | Status: AC | PRN
  Administered 2023-10-03: 100 mL via INTRAVENOUS

## 2023-10-03 MED ORDER — METHOCARBAMOL 500 MG PO TABS: 500.0000 mg | ORAL_TABLET | Freq: Three times a day (TID) | ORAL | 0 refills | Status: AC | PRN

## 2023-10-03 MED ORDER — IBUPROFEN 800 MG PO TABS: 800.0000 mg | ORAL_TABLET | Freq: Four times a day (QID) | ORAL | 0 refills | Status: AC | PRN
